# Patient Record
Sex: Male | Born: 1995
Health system: Southern US, Community
[De-identification: ages and names within clinical notes are randomized; demographics above are authoritative.]

## PROBLEM LIST (undated history)

## (undated) DIAGNOSIS — M549 Dorsalgia, unspecified: Secondary | ICD-10-CM

## (undated) DIAGNOSIS — J45909 Unspecified asthma, uncomplicated: Secondary | ICD-10-CM

## (undated) DIAGNOSIS — N4400 Torsion of testis, unspecified: Secondary | ICD-10-CM

## (undated) HISTORY — PX: SURGERY SCROTAL / TESTICULAR: SUR1316

---
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
## 2016-09-18 ENCOUNTER — Emergency Department (HOSPITAL_COMMUNITY)
Admission: EM | Admit: 2016-09-18 | Discharge: 2016-09-18 | Disposition: A | Discharge status: Home / Self Care | Payer: Managed Care, Other (non HMO) | Attending: Emergency Medicine | Admitting: Emergency Medicine

## 2016-09-18 DIAGNOSIS — J45901 Unspecified asthma with (acute) exacerbation: Secondary | ICD-10-CM | POA: Insufficient documentation

## 2016-09-18 MED ORDER — ALBUTEROL SULFATE HFA 108 (90 BASE) MCG/ACT IN AERS
2.0000 | INHALATION_SPRAY | Freq: Once | RESPIRATORY_TRACT | Status: AC
  Administered 2016-09-18: 2 via RESPIRATORY_TRACT
  Filled 2016-09-18: qty 6.7

## 2016-09-18 MED ORDER — PREDNISONE 20 MG PO TABS
40.0000 mg | ORAL_TABLET | Freq: Once | ORAL | Status: AC
  Administered 2016-09-18: 40 mg via ORAL
  Filled 2016-09-18: qty 2

## 2016-09-18 MED ORDER — ALBUTEROL SULFATE HFA 108 (90 BASE) MCG/ACT IN AERS: 1.0000 | INHALATION_SPRAY | Freq: Four times a day (QID) | RESPIRATORY_TRACT | 0 refills | Status: DC | PRN

## 2016-09-18 MED ORDER — PREDNISONE 20 MG PO TABS: 40.0000 mg | ORAL_TABLET | Freq: Every day | ORAL | 0 refills | Status: AC

## 2016-09-18 NOTE — ED Triage Notes (Signed)
C/o sob onset several days ago states he just moved back here from [place] 2 days ago and hasn't had his inhaler for about 2 weeks. States sob  Got worse last pm

## 2016-09-18 NOTE — ED Provider Notes (Signed)
Emergency Department Provider Note   I have reviewed the triage vital signs and the nursing notes.   HISTORY  Chief Complaint Shortness of Breath   HPI Stephen Andrews is a 21 y.o. male with PMH of asthma presents to the emergency department for evaluation of difficulty breathing. His symptoms started yesterday after mowing the lawn. They worsened throughout the night and this morning. He has recently moved back to the area from [place] and ran out of his albuterol inhaler prescription. With symptoms worsening he called EMS and received a breathing treatment in route. He states after that he is feeling much better. No fevers or chills. No chest pain. No vomiting or diarrhea. Symptoms worse with exertion. No radiating symptoms.    No past medical history on file.  There are no active problems to display for this patient.   No past surgical history on file.    Allergies Patient has no allergy information on record.  No family history on file.  Social History Social History  Substance Use Topics  . Smoking status: Not on file  . Smokeless tobacco: Not on file  . Alcohol use Not on file    Review of Systems  Constitutional: No fever/chills Eyes: No visual changes. ENT: No sore throat. Cardiovascular: Denies chest pain. Respiratory: Positive shortness of breath. Gastrointestinal: No abdominal pain.  No nausea, no vomiting.  No diarrhea.  No constipation. Genitourinary: Negative for dysuria. Musculoskeletal: Negative for back pain. Skin: Negative for rash. Neurological: Negative for headaches, focal weakness or numbness.  10-point ROS otherwise negative.  ____________________________________________   PHYSICAL EXAM:  VITAL SIGNS: ED Triage Vitals [09/18/16 0816]  Enc Vitals Group     BP (!) 142/72     Pulse Rate 82     Resp 16     Temp 97.6 F (36.4 C)     Temp Source Oral     SpO2 99 %     Weight 175 lb (79.4 kg)     Height 5' 8.5" (1.74 m)    Constitutional: Alert and oriented. Well appearing and in no acute distress. Eyes: Conjunctivae are normal.  Head: Atraumatic. Nose: No congestion/rhinnorhea. Mouth/Throat: Mucous membranes are moist.  Oropharynx non-erythematous. Neck: No stridor.   Cardiovascular: Normal rate, regular rhythm. Good peripheral circulation. Grossly normal heart sounds.   Respiratory: Normal respiratory effort.  No retractions. Lungs CTAB. Gastrointestinal: Soft and nontender. No distention.  Musculoskeletal: No lower extremity tenderness nor edema. No gross deformities of extremities. Neurologic:  Normal speech and language. No gross focal neurologic deficits are appreciated.  Skin:  Skin is warm, dry and intact. No rash noted.  ____________________________________________   PROCEDURES  Procedure(s) performed:   Procedures  None ____________________________________________   INITIAL IMPRESSION / ASSESSMENT AND PLAN / ED COURSE  Pertinent labs & imaging results that were available during my care of the patient were reviewed by me and considered in my medical decision making (see chart for details).  Patient resents to the emergency department for evaluation of difficulty breathing with presumed asthma exacerbation. He is out of his albuterol inhaler at home but improved significantly with albuterol neb given by EMS. On my evaluation the patient is awake, alert, comfortable. No wheezing on exam. No hypoxemia. No fevers or chills. Plan to provide albuterol inhaler here in the emergency department along with steroid burst. Will also refer to PCP for continued outpatient mgmt.   At this time, I do not feel there is any life-threatening condition present. I have reviewed  and discussed all results (EKG, imaging, lab, urine as appropriate), exam findings with patient. I have reviewed nursing notes and appropriate previous records.  I feel the patient is safe to be discharged home without further emergent  workup. Discussed usual and customary return precautions. Patient and family (if present) verbalize understanding and are comfortable with this plan.  Patient will follow-up with their primary care provider. If they do not have a primary care provider, information for follow-up has been provided to them. All questions have been answered.  ____________________________________________  FINAL CLINICAL IMPRESSION(S) / ED DIAGNOSES  Final diagnoses:  Mild asthma with exacerbation, unspecified whether persistent     MEDICATIONS GIVEN DURING THIS VISIT:  Medications  albuterol (PROVENTIL HFA;VENTOLIN HFA) 108 (90 Base) MCG/ACT inhaler 2 puff (not administered)  predniSONE (DELTASONE) tablet 40 mg (not administered)     NEW OUTPATIENT MEDICATIONS STARTED DURING THIS VISIT:  New Prescriptions   ALBUTEROL (PROVENTIL HFA;VENTOLIN HFA) 108 (90 BASE) MCG/ACT INHALER    Inhale 1-2 puffs into the lungs every 6 (six) hours as needed for wheezing or shortness of breath.   PREDNISONE (DELTASONE) 20 MG TABLET    Take 2 tablets (40 mg total) by mouth daily.     Note:  This document was prepared using Dragon voice recognition software and may include unintentional dictation errors.  Alona Bene, MD Emergency Medicine   Long, Arlyss Repress, MD 09/18/16 [phone]

## 2016-09-18 NOTE — Discharge Instructions (Signed)
We believe that your symptoms are caused today by an exacerbation of your asthma.  Please take the prescribed medications and any medications that you have at home.  Follow up with your doctor as recommended.  If you develop any new or worsening symptoms, including but not limited to fever, persistent vomiting, worsening shortness of breath, or other symptoms that concern you, please return to the Emergency Department immediately. ° ° °Asthma °Asthma is a recurring condition in which the airways tighten and narrow. Asthma can make it difficult to breathe. It can cause coughing, wheezing, and shortness of breath. Asthma episodes, also called asthma attacks, range from minor to life-threatening. Asthma cannot be cured, but medicines and lifestyle changes can help control it. °CAUSES °Asthma is believed to be caused by inherited (genetic) and environmental factors, but its exact cause is unknown. Asthma may be triggered by allergens, lung infections, or irritants in the air. Asthma triggers are different for each person. Common triggers include:  °Animal dander. °Dust mites. °Cockroaches. °Pollen from trees or grass. °Mold. °Smoke. °Air pollutants such as dust, household cleaners, hair sprays, aerosol sprays, paint fumes, strong chemicals, or strong odors. °Cold air, weather changes, and winds (which increase molds and pollens in the air). °Strong emotional expressions such as crying or laughing hard. °Stress. °Certain medicines (such as aspirin) or types of drugs (such as beta-blockers). °Sulfites in foods and drinks. Foods and drinks that may contain sulfites include dried fruit, potato chips, and sparkling grape juice. °Infections or inflammatory conditions such as the flu, a cold, or an inflammation of the nasal membranes (rhinitis). °Gastroesophageal reflux disease (GERD). °Exercise or strenuous activity. °SYMPTOMS °Symptoms may occur immediately after asthma is triggered or many hours later. Symptoms  include: °Wheezing. °Excessive nighttime or early morning coughing. °Frequent or severe coughing with a common cold. °Chest tightness. °Shortness of breath. °DIAGNOSIS  °The diagnosis of asthma is made by a review of your medical history and a physical exam. Tests may also be performed. These may include: °Lung function studies. These tests show how much air you breathe in and out. °Allergy tests. °Imaging tests such as X-rays. °TREATMENT  °Asthma cannot be cured, but it can usually be controlled. Treatment involves identifying and avoiding your asthma triggers. It also involves medicines. There are 2 classes of medicine used for asthma treatment:  °Controller medicines. These prevent asthma symptoms from occurring. They are usually taken every day. °Reliever or rescue medicines. These quickly relieve asthma symptoms. They are used as needed and provide short-term relief. °Your health care provider will help you create an asthma action plan. An asthma action plan is a written plan for managing and treating your asthma attacks. It includes a list of your asthma triggers and how they may be avoided. It also includes information on when medicines should be taken and when their dosage should be changed. An action plan may also involve the use of a device called a peak flow meter. A peak flow meter measures how well the lungs are working. It helps you monitor your condition. °HOME CARE INSTRUCTIONS  °Take medicines only as directed by your health care provider. Speak with your health care provider if you have questions about how or when to take the medicines. °Use a peak flow meter as directed by your health care provider. Record and keep track of readings. °Understand and use the action plan to help minimize or stop an asthma attack without needing to seek medical care. °Control your home environment in the following   ways to help prevent asthma attacks: °Do not smoke. Avoid being exposed to secondhand smoke. °Change  your heating and air conditioning filter regularly. °Limit your use of fireplaces and wood stoves. °Get rid of pests (such as roaches and mice) and their droppings. °Throw away plants if you see mold on them. °Clean your floors and dust regularly. Use unscented cleaning products. °Try to have someone else vacuum for you regularly. Stay out of rooms while they are being vacuumed and for a short while afterward. If you vacuum, use a dust mask from a hardware store, a double-layered or microfilter vacuum cleaner bag, or a vacuum cleaner with a HEPA filter. °Replace carpet with wood, tile, or vinyl flooring. Carpet can trap dander and dust. °Use allergy-proof pillows, mattress covers, and box spring covers. °Wash bed sheets and blankets every week in hot water and dry them in a dryer. °Use blankets that are made of polyester or cotton. °Clean bathrooms and kitchens with bleach. If possible, have someone repaint the walls in these rooms with mold-resistant paint. Keep out of the rooms that are being cleaned and painted. °Wash hands frequently. °SEEK MEDICAL CARE IF:  °You have wheezing, shortness of breath, or a cough even if taking medicine to prevent attacks. °The colored mucus you cough up (sputum) is thicker than usual. °Your sputum changes from clear or white to yellow, green, gray, or bloody. °You have any problems that may be related to the medicines you are taking (such as a rash, itching, swelling, or trouble breathing). °You are using a reliever medicine more than 2-3 times per week. °Your peak flow is still at 50-79% of your personal best after following your action plan for 1 hour. °You have a fever. °SEEK IMMEDIATE MEDICAL CARE IF:  °You seem to be getting worse and are unresponsive to treatment during an asthma attack. °You are short of breath even at rest. °You get short of breath when doing very little physical activity. °You have difficulty eating, drinking, or talking due to asthma symptoms. °You  develop chest pain. °You develop a fast heartbeat. °You have a bluish color to your lips or fingernails. °You are light-headed, dizzy, or faint. °Your peak flow is less than 50% of your personal best. °MAKE SURE YOU:  °Understand these instructions. °Will watch your condition. °Will get help right away if you are not doing well or get worse. °Document Released: 04/10/2005 Document Revised: 08/25/2013 Document Reviewed: 11/07/2012 °[employer]® Patient Information ©2015 [employer]. This information is not intended to replace advice given to you by your health care provider. Make sure you discuss any questions you have with your health care provider. ° °How to Use a Nebulizer °If you have asthma or other breathing problems, you might need to breathe in (inhale) medicine. This can be done with a nebulizer. A nebulizer is a device that turns liquid medicine into a mist that you can inhale.  °There are different kinds of nebulizers. Most are small. With some, you breathe in through a mouthpiece. With others, a mask fits over your nose and mouth. Most nebulizers must be connected to a small air compressor. Air is forced through tubing from the compressor to the nebulizer. The forced air changes the liquid into a fine spray. °RISKS AND COMPLICATIONS °The nebulizer must work properly for it to help your breathing. If the nebulizer does not produce mist, or if foam comes out, this indicates that the nebulizer is not working properly. Sometimes a filter can get clogged, or   there might be a problem with the air compressor. Check the instruction booklet that came with your nebulizer. It should tell you how to fix problems or where to call for help. You should have at least one extra nebulizer at home. That way, you will always have one when you need it.  °HOW TO PREPARE BEFORE USING THE NEBULIZER °Take these steps before using the nebulizer: °Check your medicine. Make sure it has not expired and is not damaged in any way.    °Wash your hands with soap and water.   °Put all the parts of your nebulizer on a sturdy, flat surface. Make sure the tubing connects the compressor and the nebulizer. °Measure the liquid medicine according to your health care provider's instructions. Pour it into the nebulizer. °Attach the mouthpiece or mask.   °Test the nebulizer by turning it on to make sure a spray is coming out. Then, turn it off.   °HOW TO USE THE NEBULIZER °Sit down and focus on staying relaxed.   °If your nebulizer has a mask, put it over your nose and mouth. If you use a mouthpiece, put it in your mouth. Press your lips firmly around the mouthpiece. °Turn on the nebulizer.   °Breathe out.   °Some nebulizers have a finger valve. If yours does, cover up the air hole so the air gets to the nebulizer. °Once the medicine begins to mist out, take slow, deep breaths. If there is a finger valve, release it at the end of your breath. °Continue taking slow, deep breaths until the nebulizer is empty.   °Be sure to stop the machine at any point if you start coughing or if the medicine foams or bubbles. °HOW TO CLEAN THE NEBULIZER  °The nebulizer and all its parts must be kept very clean. Follow the manufacturer's instructions for cleaning. For most nebulizers, you should follow these guidelines: °Wash the nebulizer after each use. Use warm water and soap. Rinse it well. Shake the nebulizer to remove extra water. Put it on a clean towel until it is completely dry. To make sure it is dry, put the nebulizer back together. Turn on the compressor for a few minutes. This will blow air through the nebulizer.   °Do not wash the tubing or the finger valve.   °Store the nebulizer in a dust-free place.   °Inspect the filter every week. Replace it any time it looks dirty.   °Sometimes the nebulizer will need a more complete cleaning. The instruction booklet should say how often you need to do this. °SEEK MEDICAL CARE IF:  °You continue to have difficulty  breathing.   °You have trouble using the nebulizer.   °Document Released: 03/29/2009 Document Revised: 08/25/2013 Document Reviewed: 09/30/2012 °[employer]® Patient Information ©2015 [employer]. This information is not intended to replace advice given to you by your health care provider. Make sure you discuss any questions you have with your health care provider. ° °How to Use an Inhaler °Proper inhaler technique is very important. Good technique ensures that the medicine reaches the lungs. Poor technique results in depositing the medicine on the tongue and back of the throat rather than in the airways. If you do not use the inhaler with good technique, the medicine will not help you. °STEPS TO FOLLOW IF USING AN INHALER WITHOUT AN EXTENSION TUBE °Remove the cap from the inhaler. °If you are using the inhaler for the first time, you will need to prime it. Shake the inhaler for   5 seconds and release four puffs into the air, away from your face. Ask your health care provider or pharmacist if you have questions about priming your inhaler. °Shake the inhaler for 5 seconds before each breath in (inhalation). °Position the inhaler so that the top of the canister faces up. °Put your index finger on the top of the medicine canister. Your thumb supports the bottom of the inhaler. °Open your mouth. °Either place the inhaler between your teeth and place your lips tightly around the mouthpiece, or hold the inhaler 1-2 inches away from your open mouth. If you are unsure of which technique to use, ask your health care provider. °Breathe out (exhale) normally and as completely as possible. °Press the canister down with your index finger to release the medicine. °At the same time as the canister is pressed, inhale deeply and slowly until your lungs are completely filled. This should take 4-6 seconds. Keep your tongue down. °Hold the medicine in your lungs for 5-10 seconds (10 seconds is best). This helps the medicine get into the  small airways of your lungs. °Breathe out slowly, through pursed lips. Whistling is an example of pursed lips. °Wait at least 15-30 seconds between puffs. Continue with the above steps until you have taken the number of puffs your health care provider has ordered. Do not use the inhaler more than your health care provider tells you. °Replace the cap on the inhaler. °Follow the directions from your health care provider or the inhaler insert for cleaning the inhaler. °STEPS TO FOLLOW IF USING AN INHALER WITH AN EXTENSION (SPACER) °Remove the cap from the inhaler. °If you are using the inhaler for the first time, you will need to prime it. Shake the inhaler for 5 seconds and release four puffs into the air, away from your face. Ask your health care provider or pharmacist if you have questions about priming your inhaler. °Shake the inhaler for 5 seconds before each breath in (inhalation). °Place the open end of the spacer onto the mouthpiece of the inhaler. °Position the inhaler so that the top of the canister faces up and the spacer mouthpiece faces you. °Put your index finger on the top of the medicine canister. Your thumb supports the bottom of the inhaler and the spacer. °Breathe out (exhale) normally and as completely as possible. °Immediately after exhaling, place the spacer between your teeth and into your mouth. Close your lips tightly around the spacer. °Press the canister down with your index finger to release the medicine. °At the same time as the canister is pressed, inhale deeply and slowly until your lungs are completely filled. This should take 4-6 seconds. Keep your tongue down and out of the way. °Hold the medicine in your lungs for 5-10 seconds (10 seconds is best). This helps the medicine get into the small airways of your lungs. Exhale. °Repeat inhaling deeply through the spacer mouthpiece. Again hold that breath for up to 10 seconds (10 seconds is best). Exhale slowly. If it is difficult to take  this second deep breath through the spacer, breathe normally several times through the spacer. Remove the spacer from your mouth. °Wait at least 15-30 seconds between puffs. Continue with the above steps until you have taken the number of puffs your health care provider has ordered. Do not use the inhaler more than your health care provider tells you. °Remove the spacer from the inhaler, and place the cap on the inhaler. °Follow the directions from your health care provider   or the inhaler insert for cleaning the inhaler and spacer. °If you are using different kinds of inhalers, use your quick relief medicine to open the airways 10-15 minutes before using a steroid if instructed to do so by your health care provider. If you are unsure which inhalers to use and the order of using them, ask your health care provider, nurse, or respiratory therapist. °If you are using a steroid inhaler, always rinse your mouth with water after your last puff, then gargle and spit out the water. Do not swallow the water. °AVOID: °Inhaling before or after starting the spray of medicine. It takes practice to coordinate your breathing with triggering the spray. °Inhaling through the nose (rather than the mouth) when triggering the spray. °HOW TO DETERMINE IF YOUR INHALER IS FULL OR NEARLY EMPTY °You cannot know when an inhaler is empty by shaking it. A few inhalers are now being made with dose counters. Ask your health care provider for a prescription that has a dose counter if you feel you need that extra help. If your inhaler does not have a counter, ask your health care provider to help you determine the date you need to refill your inhaler. Write the refill date on a calendar or your inhaler canister. Refill your inhaler 7-10 days before it runs out. Be sure to keep an adequate supply of medicine. This includes making sure it is not expired, and that you have a spare inhaler.  °SEEK MEDICAL CARE IF:  °Your symptoms are only partially  relieved with your inhaler. °You are having trouble using your inhaler. °You have some increase in phlegm. °SEEK IMMEDIATE MEDICAL CARE IF:  °You feel little or no relief with your inhalers. You are still wheezing and are feeling shortness of breath or tightness in your chest or both. °You have dizziness, headaches, or a fast heart rate. °You have chills, fever, or night sweats. °You have a noticeable increase in phlegm production, or there is blood in the phlegm. °MAKE SURE YOU:  °Understand these instructions. °Will watch your condition. °Will get help right away if you are not doing well or get worse. °Document Released: 04/07/2000 Document Revised: 01/29/2013 Document Reviewed: 11/07/2012 °[employer]® Patient Information ©2015 [employer]. This information is not intended to replace advice given to you by your health care provider. Make sure you discuss any questions you have with your health care provider. ° ° ° °

## 2016-10-26 ENCOUNTER — Encounter (HOSPITAL_COMMUNITY): Payer: Self-pay

## 2016-10-26 ENCOUNTER — Emergency Department (HOSPITAL_COMMUNITY)
Admission: EM | Admit: 2016-10-26 | Discharge: 2016-10-26 | Disposition: A | Discharge status: Home / Self Care | Payer: Self-pay | Attending: Emergency Medicine | Admitting: Emergency Medicine

## 2016-10-26 ENCOUNTER — Emergency Department (HOSPITAL_COMMUNITY): Payer: Self-pay

## 2016-10-26 DIAGNOSIS — M545 Low back pain, unspecified: Secondary | ICD-10-CM

## 2016-10-26 HISTORY — DX: Torsion of testis, unspecified: N44.00

## 2016-10-26 MED ORDER — DIAZEPAM 5 MG PO TABS
5.0000 mg | ORAL_TABLET | Freq: Once | ORAL | Status: AC
  Administered 2016-10-26: 5 mg via ORAL
  Filled 2016-10-26: qty 1

## 2016-10-26 MED ORDER — NAPROXEN 375 MG PO TABS: 375.0000 mg | ORAL_TABLET | Freq: Two times a day (BID) | ORAL | 0 refills | Status: DC

## 2016-10-26 MED ORDER — CYCLOBENZAPRINE HCL 10 MG PO TABS: 10.0000 mg | ORAL_TABLET | Freq: Two times a day (BID) | ORAL | 0 refills | Status: DC | PRN

## 2016-10-26 MED ORDER — KETOROLAC TROMETHAMINE 30 MG/ML IJ SOLN
30.0000 mg | Freq: Once | INTRAMUSCULAR | Status: AC
  Administered 2016-10-26: 30 mg via INTRAMUSCULAR
  Filled 2016-10-26: qty 1

## 2016-10-26 NOTE — ED Triage Notes (Signed)
Pt presents with c/o back pain that started a couple of days ago. Pt denies any injury to his back. Ambulatory to triage.

## 2016-10-26 NOTE — ED Provider Notes (Signed)
[facility]-EMERGENCY DEPT Provider Note   CSN: [id number] Arrival date & time: 10/26/16  1805     History   Chief Complaint Chief Complaint  Patient presents with  . Back Pain    HPI Stephen Andrews is a 21 y.o. male.  HPI 60 year old African-American male with no significant past medical history presents to the emergency Department today with complaints of left lower back pain. Patient denies any trauma. States that it is a muscle ache in the left lower back that is constant. Movement and sitting makes the pain worse. Nothing makes the pain better. States that he does sit a lot for work. He has tried muscle rubs, icy hot, Motrin with little relief. Patient has been ambulatory. Pain has been ongoing for 2 days. Pt denies any fever, ha, night sweats, hx of ivdu/cancer, loss or bowel or bladder, urinary retention, saddle paresthesias, urinary symptoms, lower extremity paresthesias.   Past Medical History:  Diagnosis Date  . Testicular torsion     There are no active problems to display for this patient.   Past Surgical History:  Procedure Laterality Date  . SURGERY SCROTAL / TESTICULAR     testicular torsion surgery        Home Medications    Prior to Admission medications   Medication Sig Start Date End Date Taking? Authorizing Provider  albuterol (PROVENTIL HFA;VENTOLIN HFA) 108 (90 Base) MCG/ACT inhaler Inhale 1-2 puffs into the lungs every 6 (six) hours as needed for wheezing or shortness of breath. 09/18/16   Long, Arlyss Repress, MD  cyclobenzaprine (FLEXERIL) 10 MG tablet Take 1 tablet (10 mg total) by mouth 2 (two) times daily as needed for muscle spasms. 10/26/16   Rise Mu, PA-C  naproxen (NAPROSYN) 375 MG tablet Take 1 tablet (375 mg total) by mouth 2 (two) times daily. 10/26/16   Rise Mu, PA-C    Family History History reviewed. No pertinent family history.  Social History Social History  Substance Use Topics  . Smoking status: Never Smoker  .  Smokeless tobacco: Never Used  . Alcohol use Yes     Comment: socially      Allergies   Patient has no known allergies.   Review of Systems Review of Systems  Constitutional: Negative for chills and fever.  Gastrointestinal: Negative for nausea and vomiting.  Genitourinary: Negative for dysuria, frequency and urgency.  Musculoskeletal: Positive for back pain. Negative for gait problem.  Skin: Negative for rash and wound.  Neurological: Negative for weakness and numbness.     Physical Exam Updated Vital Signs BP 132/75 (BP Location: Left Arm)   Pulse (!) 57   Temp 97.9 F (36.6 C) (Oral)   Resp 18   Ht 5\' 9"  (1.753 m)   Wt 81.6 kg (180 lb)   SpO2 100%   BMI 26.58 kg/m   Physical Exam  Constitutional: He is oriented to person, place, and time. He appears well-developed and well-nourished. No distress.  HENT:  Head: Normocephalic and atraumatic.  Eyes: Right eye exhibits no discharge. Left eye exhibits no discharge. No scleral icterus.  Neck: Normal range of motion.  Cardiovascular: Intact distal pulses.   Pulmonary/Chest: No respiratory distress.  Abdominal:  No CVA tenderness.  Musculoskeletal: Normal range of motion.  No midline T spine or L spine tenderness. No deformities or step offs noted. Full ROM. Pelvis is stable.  Patient with left lower lumbar tenderness. Tense musculature noted. No CVA tenderness.  DP pulses are 2+ bilaterally. Sensation intact. Cap  refill normal.   Neurological: He is alert and oriented to person, place, and time.  Sensation intact. Strength 5 out of 5 in lower extremities.  Skin: Skin is warm and dry. Capillary refill takes less than 2 seconds. No pallor.  Psychiatric: His behavior is normal. Judgment and thought content normal.  Nursing note and vitals reviewed.    ED Treatments / Results  Labs (all labs ordered are listed, but only abnormal results are displayed) Labs Reviewed - No data to display  EKG  EKG  Interpretation None       Radiology Dg Lumbar Spine Complete  Result Date: 10/26/2016 CLINICAL DATA:  Acute low back and lumbar spine pain for 2 days. No known injury. Initial encounter. EXAM: LUMBAR SPINE - COMPLETE 4+ VIEW COMPARISON:  None. FINDINGS: There is no evidence of lumbar spine fracture. Alignment is normal. Intervertebral disc spaces are maintained. IMPRESSION: Negative. Electronically Signed   By: Harmon PierJeffrey  Hu M.D.   On: 10/26/2016 20:21    Procedures Procedures (including critical care time)  Medications Ordered in ED Medications  ketorolac (TORADOL) 30 MG/ML injection 30 mg (30 mg Intramuscular Given 10/26/16 1954)  diazepam (VALIUM) tablet 5 mg (5 mg Oral Given 10/26/16 1952)     Initial Impression / Assessment and Plan / ED Course  I have reviewed the triage vital signs and the nursing notes.  Pertinent labs & imaging results that were available during my care of the patient were reviewed by me and considered in my medical decision making (see chart for details).     Patient with back pain.  No neurological deficits and normal neuro exam.  Patient can walk but states is painful.  No loss of bowel or bladder control.  No concern for cauda equina.  No fever, night sweats, weight loss, h/o cancer, IVDU.  RICE protocol and pain medicine indicated and discussed with patient.    Final Clinical Impressions(s) / ED Diagnoses   Final diagnoses:  Acute left-sided low back pain without sciatica    New Prescriptions Discharge Medication List as of 10/26/2016  8:29 PM    START taking these medications   Details  cyclobenzaprine (FLEXERIL) 10 MG tablet Take 1 tablet (10 mg total) by mouth 2 (two) times daily as needed for muscle spasms., Starting Thu 10/26/2016, Print    naproxen (NAPROSYN) 375 MG tablet Take 1 tablet (375 mg total) by mouth 2 (two) times daily., Starting Thu 10/26/2016, Print         Rise MuLeaphart, [place] Chelf T, PA-C 10/26/16 2048    Little, Ambrose Finlandachel Morgan,  MD 10/27/16 [phone]

## 2016-10-26 NOTE — Discharge Instructions (Signed)
Workup has been normal. Please take medications as prescribed and instructed. ° °Please take the Naproxen as prescribed for pain. Do not take any additional NSAIDs including Motrin, Aleve, Ibuprofen, Advil. ° °Please the the flexeril for muscle relaxation. This medication will make you drowsy so avoid situation that could place you in danger.  ° ° °SEEK IMMEDIATE MEDICAL ATTENTION IF: °New numbness, tingling, weakness, or problem with the use of your arms or legs.  °Severe back pain not relieved with medications.  °Change in bowel or bladder control.  °Urinary retention.  °Numbness in your groin.  °Increasing pain in any areas of the body (such as chest or abdominal pain).  °Shortness of breath, dizziness or fainting.  °Nausea (feeling sick to your stomach), vomiting, fever, or sweats. ° °

## 2016-10-30 ENCOUNTER — Emergency Department (HOSPITAL_COMMUNITY): Payer: Managed Care, Other (non HMO)

## 2016-10-30 ENCOUNTER — Encounter (HOSPITAL_COMMUNITY): Payer: Self-pay | Admitting: *Deleted

## 2016-10-30 ENCOUNTER — Emergency Department (HOSPITAL_COMMUNITY)
Admission: EM | Admit: 2016-10-30 | Discharge: 2016-10-30 | Disposition: A | Discharge status: Home / Self Care | Payer: Managed Care, Other (non HMO) | Attending: Emergency Medicine | Admitting: Emergency Medicine

## 2016-10-30 DIAGNOSIS — J4521 Mild intermittent asthma with (acute) exacerbation: Secondary | ICD-10-CM | POA: Insufficient documentation

## 2016-10-30 HISTORY — DX: Unspecified asthma, uncomplicated: J45.909

## 2016-10-30 HISTORY — DX: Dorsalgia, unspecified: M54.9

## 2016-10-30 MED ORDER — ALBUTEROL SULFATE HFA 108 (90 BASE) MCG/ACT IN AERS
2.0000 | INHALATION_SPRAY | Freq: Four times a day (QID) | RESPIRATORY_TRACT | Status: DC | PRN
  Administered 2016-10-30: 2 via RESPIRATORY_TRACT
  Filled 2016-10-30: qty 6.7

## 2016-10-30 MED ORDER — PREDNISONE 20 MG PO TABS: ORAL_TABLET | ORAL | 0 refills | Status: DC

## 2016-10-30 NOTE — ED Notes (Signed)
Bed: WA21 Expected date:  Expected time:  Means of arrival:  Comments: 21 yo F/ Shortness of breath

## 2016-10-30 NOTE — ED Triage Notes (Signed)
While at work pt felt shot of breath. Has history of asthma however d/t issues with insurance he hasn't been taking his inhalers. Pt states it became more difficult to breathe and so he called EMS. In route pt received 125mg  solumedrol, 10mg  Albuterol nebulizer and 1mg  Atrovent nebulizer. Pt's original oxygen sats were 95% RM AIR with EMS. Presently 97% on RM AIR.

## 2016-10-30 NOTE — Discharge Instructions (Signed)
Take the prescribed medication as directed. Follow-up with [facility]-- can call in the morning for appointment. Return to the ED for new or worsening symptoms.

## 2016-10-30 NOTE — ED Provider Notes (Signed)
[facility] Provider Note   CSN: [id number] Arrival date & time: 10/30/16  0327     History   Chief Complaint Chief Complaint  Patient presents with  . Respiratory Distress    HPI Stephen Andrews is a 21 y.o. male.  The history is provided by the patient and medical records.    21 year old male with history of asthma, back pain, presenting to the ED for shortness of breath. Patient reports he recently relocated to [facility] from [place] about 3 weeks ago. 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 States he tried to tough it out and when on home. States he took a shower and got ready to get in bed and became increasingly short of breath. He usually uses albuterol for his asthma, but he does not have any of his home inhalers. He called EMS and was given Solu-Medrol, albuterol, and Atrovent with vast improvement of his symptoms. On initial evaluation here, patient states he feels back to baseline.  He denies any recent cough, fever, chest pain.  No cardiac hx.  Past Medical History:  Diagnosis Date  . Asthma   . Back pain   . Testicular torsion     There are no active problems to display for this patient.   Past Surgical History:  Procedure Laterality Date  . SURGERY SCROTAL / TESTICULAR     testicular torsion surgery        Home Medications    Prior to Admission medications   Medication Sig Start Date End Date Taking? Authorizing Provider  albuterol (PROVENTIL HFA;VENTOLIN HFA) 108 (90 Base) MCG/ACT inhaler Inhale 1-2 puffs into the lungs every 6 (six) hours as needed for wheezing or shortness of breath. 09/18/16   Long, Arlyss Repress, MD  cyclobenzaprine (FLEXERIL) 10 MG tablet Take 1 tablet (10 mg total) by mouth 2 (two) times daily as needed for muscle spasms. 10/26/16   Rise Mu, PA-C  naproxen (NAPROSYN) 375 MG tablet Take 1 tablet (375 mg total) by mouth 2 (two) times daily. 10/26/16   Rise Mu,  PA-C    Family History History reviewed. No pertinent family history.  Social History Social History  Substance Use Topics  . Smoking status: Never Smoker  . Smokeless tobacco: Never Used  . Alcohol use Yes     Comment: socially      Allergies   Patient has no known allergies.   Review of Systems Review of Systems  Respiratory: Positive for shortness of breath and wheezing.   All other systems reviewed and are negative.    Physical Exam Updated Vital Signs BP 135/77 (BP Location: Right Arm)   Pulse 86   Temp 97.8 F (36.6 C) (Oral)   Ht 5\' 9"  (1.753 m)   Wt 81.6 kg (180 lb)   SpO2 100%   BMI 26.58 kg/m   Physical Exam  Constitutional: He is oriented to person, place, and time. He appears well-developed and well-nourished.  HENT:  Head: Normocephalic and atraumatic.  Mouth/Throat: Oropharynx is clear and moist.  Eyes: Conjunctivae and EOM are normal. Pupils are equal, round, and reactive to light.  Neck: Normal range of motion.  Cardiovascular: Normal rate, regular rhythm and normal heart sounds.   Pulmonary/Chest: Effort normal and breath sounds normal. No respiratory distress. He has no wheezes.  Respirations unlabored, able to speak in full sentences, lungs clear bilaterally, O2 sats 98% during exam  Abdominal: Soft. Bowel sounds are normal.  Musculoskeletal: Normal range of motion.  Neurological: He is alert and oriented to person, place, and time.  Skin: Skin is warm and dry.  Psychiatric: He has a normal mood and affect.  Nursing note and vitals reviewed.    ED Treatments / Results  Labs (all labs ordered are listed, but only abnormal results are displayed) Labs Reviewed - No data to display  EKG  EKG Interpretation None       Radiology Dg Chest 2 View  Result Date: 10/30/2016 CLINICAL DATA:  21 year old male with shortness of breath. History of asthma. EXAM: CHEST  2 VIEW COMPARISON:  None. FINDINGS: The heart size and mediastinal contours  are within normal limits. Both lungs are clear. The visualized skeletal structures are unremarkable. IMPRESSION: No active cardiopulmonary disease. Electronically Signed   By: Elgie CollardArash  Radparvar M.D.   On: 10/30/2016 04:11    Procedures Procedures (including critical care time)  Medications Ordered in ED Medications  albuterol (PROVENTIL HFA;VENTOLIN HFA) 108 (90 Base) MCG/ACT inhaler 2 puff (not administered)     Initial Impression / Assessment and Plan / ED Course  I have reviewed the triage vital signs and the nursing notes.  Pertinent labs & imaging results that were available during my care of the patient were reviewed by me and considered in my medical decision making (see chart for details).  21 year old male here with asthma exacerbation. [place] [facility] [place]. Does not currently have any home medications to assist with his symptoms. He is afebrile and nontoxic in appearance here. He was treated with slight Medrol, albuterol, and Atrovent with EMS with resolution of symptoms. Respirations are clear and unlabored here.  CXR clear.  Patient continues to appear well here.  Appropriate for discharge.  Given albuterol inhaler here for home, will continue prednisone taper.  Discussed asthma exacerbation may be secondary to change in environment.  Patient is currently uninsured, referred to wellness clinic for follow-up.  Discussed plan with patient, he acknowledged understanding and agreed with plan of care.  Return precautions given for new or worsening symptoms.  Final Clinical Impressions(s) / ED Diagnoses   Final diagnoses:  Mild intermittent asthma with exacerbation    New Prescriptions New Prescriptions   PREDNISONE (DELTASONE) 20 MG TABLET    Take 40 mg by mouth daily for 3 days, then 20mg  by mouth daily for 3 days, then 10mg  daily for 3 days     Garlon HatchetSanders, Ireanna Finlayson M, PA-C 10/30/16 0553    Palumbo, April, MD 10/30/16 [phone]

## 2017-01-08 ENCOUNTER — Emergency Department (HOSPITAL_COMMUNITY)
Admission: EM | Admit: 2017-01-08 | Discharge: 2017-01-08 | Disposition: A | Discharge status: Home / Self Care | Payer: Self-pay | Attending: Emergency Medicine | Admitting: Emergency Medicine

## 2017-01-08 ENCOUNTER — Emergency Department (HOSPITAL_COMMUNITY): Payer: Self-pay

## 2017-01-08 DIAGNOSIS — J4521 Mild intermittent asthma with (acute) exacerbation: Secondary | ICD-10-CM | POA: Insufficient documentation

## 2017-01-08 MED ORDER — IPRATROPIUM-ALBUTEROL 0.5-2.5 (3) MG/3ML IN SOLN
3.0000 mL | Freq: Once | RESPIRATORY_TRACT | Status: AC
  Administered 2017-01-08: 3 mL via RESPIRATORY_TRACT
  Filled 2017-01-08: qty 3

## 2017-01-08 MED ORDER — PREDNISONE 20 MG PO TABS
40.0000 mg | ORAL_TABLET | Freq: Once | ORAL | Status: AC
  Administered 2017-01-08: 40 mg via ORAL
  Filled 2017-01-08: qty 2

## 2017-01-08 MED ORDER — ALBUTEROL SULFATE HFA 108 (90 BASE) MCG/ACT IN AERS
1.0000 | INHALATION_SPRAY | Freq: Once | RESPIRATORY_TRACT | Status: AC
  Administered 2017-01-08: 1 via RESPIRATORY_TRACT
  Filled 2017-01-08: qty 6.7

## 2017-01-08 MED ORDER — ALBUTEROL SULFATE HFA 108 (90 BASE) MCG/ACT IN AERS: 1.0000 | INHALATION_SPRAY | Freq: Four times a day (QID) | RESPIRATORY_TRACT | 0 refills | Status: DC | PRN

## 2017-01-08 MED ORDER — PREDNISONE 20 MG PO TABS: 40.0000 mg | ORAL_TABLET | Freq: Every day | ORAL | 0 refills | Status: DC

## 2017-01-08 NOTE — Discharge Instructions (Signed)
You have been treated for an asthma attack in the ED. Take prednisone daily for 4 days starting tomorrow morning.  ° °I have given you a refill of your home inhaler.  ° ° °Warm steam from a hot shower will help your symptoms.  ° °Follow up with your primary care provider for further discussion of today's ER visit.  ° °If you develop any new or worsening symptoms, including but not limited to fever, persistent vomiting, worsening shortness of breath or other symptoms that concern you, please return to the Emergency Department immediately. ° ° °

## 2017-01-08 NOTE — ED Notes (Signed)
Patient states he feels much better now. States he just moved from [place] and hasn't established care and insurance. Denies having inhalers and nebulizer at home. States he would need RX as well as access to care if possible until he can receive health benefits. A&OX4 laughing and talking. No signs of dyspnea at this point.

## 2017-01-08 NOTE — ED Provider Notes (Signed)
[facility] Provider Note   CSN: [id number] Arrival date & time: 01/08/17  0018     History   Chief Complaint Chief Complaint  Patient presents with  . Asthma    HPI Stephen Andrews is a 21 y.o. male.  HPI 21 year old African-American male past medical history significant for asthma presents to the emergency Department today with complaints of wheezing, chest tightness, shortness of breath secondary to his asthma. Patient states that this afternoon he woke up from a nap with shortness of breath and wheezing. States he went to get his inhaler and it was empty. Patient was brought in by EMS and noted to have bilateral expiratory and inspiratory wheezes. Was given 1 DuoNeb. With improvement in patient's breathing. On my assessment patient states that his breathing is at baseline. Patient states that he is not know what triggers his asthma. Denies tobacco use. Patient has been seen several times in the ED for same. Has had poor follow-up with PCP. His inhalers usually refilled in the ED. Patient denies any associated fevers, cough, chest pain, nausea, emesis, abdominal pain, headache, vision changes, lightheadedness, dizziness, lower extremity edema or calf tenderness. Past Medical History:  Diagnosis Date  . Asthma   . Back pain   . Testicular torsion     There are no active problems to display for this patient.   Past Surgical History:  Procedure Laterality Date  . SURGERY SCROTAL / TESTICULAR     testicular torsion surgery        Home Medications    Prior to Admission medications   Medication Sig Start Date End Date Taking? Authorizing Provider  albuterol (PROVENTIL HFA;VENTOLIN HFA) 108 (90 Base) MCG/ACT inhaler Inhale 1-2 puffs into the lungs every 6 (six) hours as needed for wheezing or shortness of breath. 01/08/17   Rise Mu, PA-C  predniSONE (DELTASONE) 20 MG tablet Take 2 tablets (40 mg total) by mouth daily with breakfast. 01/08/17   Rise Mu, PA-C    Family History No family history on file.  Social History Social History  Substance Use Topics  . Smoking status: Never Smoker  . Smokeless tobacco: Never Used  . Alcohol use Yes     Comment: socially      Allergies   Patient has no known allergies.   Review of Systems Review of Systems  Constitutional: Negative for chills and fever.  HENT: Negative for congestion.   Respiratory: Positive for shortness of breath and wheezing. Negative for cough.   Cardiovascular: Negative for chest pain, palpitations and leg swelling.  Gastrointestinal: Negative for abdominal pain, nausea and vomiting.  Musculoskeletal: Negative for myalgias.  Skin: Negative for rash.  Neurological: Negative for dizziness, weakness, light-headedness and headaches.  Psychiatric/Behavioral: Negative for sleep disturbance.     Physical Exam Updated Vital Signs BP 116/81 (BP Location: Left Arm)   Pulse 97   Temp 98.2 F (36.8 C) (Oral)   Resp 18   SpO2 99%   Physical Exam  Constitutional: He is oriented to person, place, and time. He appears well-developed and well-nourished.  Non-toxic appearance. No distress.  HENT:  Head: Normocephalic and atraumatic.  Mouth/Throat: Oropharynx is clear and moist.  Eyes: Pupils are equal, round, and reactive to light. Conjunctivae are normal. Right eye exhibits no discharge. Left eye exhibits no discharge.  Neck: Normal range of motion. Neck supple.  Cardiovascular: Normal rate, regular rhythm, normal heart sounds and intact distal pulses.  Exam reveals no gallop and no friction rub.  No murmur heard. Pulmonary/Chest: Effort normal. No accessory muscle usage. No tachypnea. No respiratory distress. He has decreased breath sounds. He has wheezes (minimal scattered extremity wheezes ). He has no rhonchi. He has no rales. He exhibits no tenderness.  No hypoxia.  Abdominal: Soft. Bowel sounds are normal. He exhibits no distension. There is no  tenderness. There is no rebound and no guarding.  Musculoskeletal: Normal range of motion. He exhibits no tenderness.  Lymphadenopathy:    He has no cervical adenopathy.  Neurological: He is alert and oriented to person, place, and time.  Skin: Skin is warm and dry. Capillary refill takes less than 2 seconds. No rash noted.  Psychiatric: His behavior is normal. Judgment and thought content normal.  Nursing note and vitals reviewed.    ED Treatments / Results  Labs (all labs ordered are listed, but only abnormal results are displayed) Labs Reviewed - No data to display  EKG  EKG Interpretation None       Radiology Dg Chest 2 View  Result Date: 01/08/2017 CLINICAL DATA:  Shortness of breath.  History of asthma.  Nonsmoker. EXAM: CHEST  2 VIEW COMPARISON:  10/30/2016 FINDINGS: Normal heart size and pulmonary vascularity. Central peribronchial thickening consistent with history of asthma. No airspace disease or consolidation in the lungs. No blunting of costophrenic angles. No pneumothorax. Mediastinal contours appear intact. IMPRESSION: Peribronchial thickening compatible with history of asthma. No evidence of active pulmonary disease. Electronically Signed   By: Burman Nieves M.D.   On: 01/08/2017 01:38    Procedures Procedures (including critical care time)  Medications Ordered in ED Medications  ipratropium-albuterol (DUONEB) 0.5-2.5 (3) MG/3ML nebulizer solution 3 mL (3 mLs Nebulization Given 01/08/17 0128)  predniSONE (DELTASONE) tablet 40 mg (40 mg Oral Given 01/08/17 0128)  albuterol (PROVENTIL HFA;VENTOLIN HFA) 108 (90 Base) MCG/ACT inhaler 1 puff (1 puff Inhalation Given 01/08/17 0239)     Initial Impression / Assessment and Plan / ED Course  I have reviewed the triage vital signs and the nursing notes.  Pertinent labs & imaging results that were available during my care of the patient were reviewed by me and considered in my medical decision making (see chart for  details).     Patient with mild signs and symptoms of asthma/RAD. Oxygen saturation is above 90%. No accessory muscle use, no cyanosis. Treated in the ED.  Patient feels improved after treatment. Chest x-ray is unremarkable for any focal infiltrate. Clinical presentation is not consistent with PE, ACS, dissection, pneumonia. Will discharge with predinsone and albuterol inhaler. Pt instructed to follow up with PCP. Patient is hemodynamically stable. Discussed return precautions. Pt appears safe for discharge.     Final Clinical Impressions(s) / ED Diagnoses   Final diagnoses:  Mild intermittent asthma with exacerbation    New Prescriptions New Prescriptions   ALBUTEROL (PROVENTIL HFA;VENTOLIN HFA) 108 (90 BASE) MCG/ACT INHALER    Inhale 1-2 puffs into the lungs every 6 (six) hours as needed for wheezing or shortness of breath.   PREDNISONE (DELTASONE) 20 MG TABLET    Take 2 tablets (40 mg total) by mouth daily with breakfast.     Rise Mu, PA-C 01/08/17 0241    Palumbo, April, MD 01/08/17 1610

## 2017-01-08 NOTE — ED Notes (Signed)
Patient ambulated around department. Oxygen sats maintained at 95-96%. Patient denies dyspnea. Able to talk during ambulation without difficulty.

## 2017-01-08 NOTE — ED Notes (Signed)
Bed: ZO10 Expected date:  Expected time:  Means of arrival:  Comments: 21yo M/ difficulty breathing

## 2017-01-08 NOTE — ED Triage Notes (Signed)
Pt from home via EMS with c/o sob secondary to asthma. Pt reports he ran out of his inhaler. Pt initially had bilateral wheezes but was given 10 albuterol and .05 Atrovent in route and now only has expiratory wheezes.

## 2017-02-04 ENCOUNTER — Emergency Department (HOSPITAL_COMMUNITY)
Admission: EM | Admit: 2017-02-04 | Discharge: 2017-02-05 | Disposition: A | Discharge status: Home / Self Care | Payer: Managed Care, Other (non HMO) | Attending: Emergency Medicine | Admitting: Emergency Medicine

## 2017-02-04 ENCOUNTER — Emergency Department (HOSPITAL_COMMUNITY): Payer: Managed Care, Other (non HMO)

## 2017-02-04 ENCOUNTER — Encounter (HOSPITAL_COMMUNITY): Payer: Self-pay | Admitting: Emergency Medicine

## 2017-02-04 DIAGNOSIS — J302 Other seasonal allergic rhinitis: Secondary | ICD-10-CM

## 2017-02-04 DIAGNOSIS — J301 Allergic rhinitis due to pollen: Secondary | ICD-10-CM | POA: Insufficient documentation

## 2017-02-04 DIAGNOSIS — Z79899 Other long term (current) drug therapy: Secondary | ICD-10-CM | POA: Insufficient documentation

## 2017-02-04 DIAGNOSIS — J4521 Mild intermittent asthma with (acute) exacerbation: Secondary | ICD-10-CM

## 2017-02-04 MED ORDER — ALBUTEROL SULFATE (2.5 MG/3ML) 0.083% IN NEBU
INHALATION_SOLUTION | RESPIRATORY_TRACT | Status: AC
  Filled 2017-02-04: qty 6

## 2017-02-04 MED ORDER — ALBUTEROL SULFATE (2.5 MG/3ML) 0.083% IN NEBU
5.0000 mg | INHALATION_SOLUTION | Freq: Once | RESPIRATORY_TRACT | Status: AC
  Administered 2017-02-04: 5 mg via RESPIRATORY_TRACT

## 2017-02-04 NOTE — ED Triage Notes (Signed)
C/o asthma flare-up with wheezing since last night.  Out of inhaler.

## 2017-02-05 MED ORDER — ALBUTEROL SULFATE HFA 108 (90 BASE) MCG/ACT IN AERS
2.0000 | INHALATION_SPRAY | RESPIRATORY_TRACT | Status: DC | PRN
  Administered 2017-02-05: 2 via RESPIRATORY_TRACT
  Filled 2017-02-05: qty 6.7

## 2017-02-05 MED ORDER — PREDNISONE 20 MG PO TABS: 40.0000 mg | ORAL_TABLET | Freq: Every day | ORAL | 0 refills | Status: DC

## 2017-02-05 MED ORDER — IPRATROPIUM-ALBUTEROL 0.5-2.5 (3) MG/3ML IN SOLN
3.0000 mL | Freq: Once | RESPIRATORY_TRACT | Status: AC
  Administered 2017-02-05: 3 mL via RESPIRATORY_TRACT
  Filled 2017-02-05: qty 3

## 2017-02-05 MED ORDER — CETIRIZINE HCL 10 MG PO TABS: 10.0000 mg | ORAL_TABLET | Freq: Every day | ORAL | 0 refills | Status: DC

## 2017-02-05 MED ORDER — PREDNISONE 20 MG PO TABS
60.0000 mg | ORAL_TABLET | Freq: Once | ORAL | Status: AC
  Administered 2017-02-05: 60 mg via ORAL
  Filled 2017-02-05: qty 3

## 2017-02-05 NOTE — Discharge Instructions (Signed)
Use inhaler 2 puffs every 4 hrs as needed. Take zyrtec daily for seasonal allergies. Take prednisone as prescribed until all gone. Follow up with family doctor as needed. Return if worsening.

## 2017-02-05 NOTE — ED Provider Notes (Signed)
[facility]-EMERGENCY DEPT Provider Note   CSN: [id number] Arrival date & time: 02/04/17  2246     History   Chief Complaint Chief Complaint  Patient presents with  . Asthma    HPI Stephen Andrews is a 21 y.o. male.  HPI Stephen Andrews is a 21 y.o. male with history of asthma, presents to emergency department complaining of asthma exacerbation. Patient states that he started having nasal congestion, watery and itchy eyes two days ago. Reports hx of seasonal allergies. Currently not taking any medications for this. States yesterday started to have dry cough and wheezing. States ran out of inhaler. Tried to sit in hot steamy room which helped some. Today symptoms seemed to be getting worse so he came to ED. Denies fever or chills. No chest pain. No sore throat. Currently no family doctor. No pain.   Past Medical History:  Diagnosis Date  . Asthma   . Back pain   . Testicular torsion     There are no active problems to display for this patient.   Past Surgical History:  Procedure Laterality Date  . SURGERY SCROTAL / TESTICULAR     testicular torsion surgery        Home Medications    Prior to Admission medications   Medication Sig Start Date End Date Taking? Authorizing Provider  albuterol (PROVENTIL HFA;VENTOLIN HFA) 108 (90 Base) MCG/ACT inhaler Inhale 1-2 puffs into the lungs every 6 (six) hours as needed for wheezing or shortness of breath. 01/08/17   Rise Mu, PA-C  predniSONE (DELTASONE) 20 MG tablet Take 2 tablets (40 mg total) by mouth daily with breakfast. 01/08/17   Rise Mu, PA-C    Family History No family history on file.  Social History Social History  Substance Use Topics  . Smoking status: Never Smoker  . Smokeless tobacco: Never Used  . Alcohol use Yes     Comment: socially      Allergies   Patient has no known allergies.   Review of Systems Review of Systems  Constitutional: Negative for chills and fever.  HENT: Positive  for congestion.   Eyes: Positive for discharge, redness and itching.  Respiratory: Positive for cough, chest tightness, shortness of breath and wheezing.   Cardiovascular: Negative for chest pain, palpitations and leg swelling.  Gastrointestinal: Negative for abdominal pain, diarrhea, nausea and vomiting.  Genitourinary: Negative for dysuria, frequency, hematuria and urgency.  Musculoskeletal: Negative for arthralgias, myalgias, neck pain and neck stiffness.  Skin: Negative for rash.  Allergic/Immunologic: Negative for immunocompromised state.  Neurological: Negative for dizziness, weakness, light-headedness, numbness and headaches.  All other systems reviewed and are negative.    Physical Exam Updated Vital Signs BP (!) 125/94 (BP Location: Right Arm)   Pulse 83   Temp 98.4 F (36.9 C) (Oral)   Resp 16   Ht  (1.727 m)   Wt 79.4 kg (175 lb)   SpO2 98%   BMI 26.61 kg/m   Physical Exam  Constitutional: He is oriented to person, place, and time. He appears well-developed and well-nourished. No distress.  HENT:  Head: Normocephalic and atraumatic.  Right Ear: External ear normal.  Left Ear: External ear normal.  Mouth/Throat: Oropharynx is clear and moist.  Clear rhinorrhea, pharynx erythemous, uvula midline  Eyes: Conjunctivae are normal.  Neck: Normal range of motion. Neck supple.  No meningeal signs  Cardiovascular: Normal rate, regular rhythm and normal heart sounds.   Pulmonary/Chest: Effort normal. No respiratory distress. He has wheezes.  He has no rales.  Mild end expiratory wheeze bilaterally  Abdominal: Soft. Bowel sounds are normal. There is no tenderness.  Musculoskeletal: He exhibits no edema or tenderness.  Lymphadenopathy:    He has no cervical adenopathy.  Neurological: He is alert and oriented to person, place, and time.  Skin: Skin is warm and dry. No erythema.  Psychiatric: He has a normal mood and affect.  Nursing note and vitals reviewed.    ED  Treatments / Results  Labs (all labs ordered are listed, but only abnormal results are displayed) Labs Reviewed - No data to display  EKG  EKG Interpretation None       Radiology Dg Chest 2 View  Result Date: 02/04/2017 CLINICAL DATA:  Initial evaluation for acute shortness of breath, wheezing. History of asthma. EXAM: CHEST  2 VIEW COMPARISON:  Prior radiograph from 01/08/2017. FINDINGS: Cardiac and mediastinal silhouette stable in size and contour, and remain within normal limits. Lungs are mildly hyperinflated. Scattered peribronchial thickening, likely related history of asthma. No focal infiltrates to suggest pneumonia. No pulmonary edema or pleural effusion. No pneumothorax. Osseous structures unremarkable. IMPRESSION: 1. Mild hyperinflation with diffuse peribronchial thickening, consistent with history of asthma. 2. No other active cardiopulmonary disease identified. Electronically Signed   By: Rise Mu M.D.   On: 02/04/2017 23:49    Procedures Procedures (including critical care time)  Medications Ordered in ED Medications  ipratropium-albuterol (DUONEB) 0.5-2.5 (3) MG/3ML nebulizer solution 3 mL (not administered)  predniSONE (DELTASONE) tablet 60 mg (not administered)  albuterol (PROVENTIL) (2.5 MG/3ML) 0.083% nebulizer solution 5 mg (5 mg Nebulization Given 02/04/17 2312)     Initial Impression / Assessment and Plan / ED Course  I have reviewed the triage vital signs and the nursing notes.  Pertinent labs & imaging results that were available during my care of the patient were reviewed by me and considered in my medical decision making (see chart for details).     Patient with seasonal allergies and wheezing since yesterday. Patient received one breathing treatment in triage and feels better. Still continues to wheeze on my exam. No accessory muscle use or respiratory distress. Speaking full sentences. Chest x-ray obtained by triage nurse and is normal  other than mild hyperinflation. Will give another breathing treatment and reassess.  12:37 AM Feels better. Ready for dc home. NAD. Eating a sandwich. VS normal. Plan to start on zyrtec for seasonal allergies. Prednisone burst. Inhaler provided in ED. Follow up as needed.   Vitals:   02/04/17 2302  BP: (!) 125/94  Pulse: 83  Resp: 16  Temp: 98.4 F (36.9 C)  TempSrc: Oral  SpO2: 98%  Weight: 79.4 kg (175 lb)  Height:  (1.727 m)     Final Clinical Impressions(s) / ED Diagnoses   Final diagnoses:  Mild intermittent asthma with exacerbation  Seasonal allergies    New Prescriptions New Prescriptions   No medications on file     Jaynie Crumble, PA-C 02/05/17 4098    Alvira Monday, MD 02/05/17 1418

## 2017-02-05 NOTE — ED Notes (Signed)
Pt complains of an asthma attack with wheezing. Pt reports the wheezing is not very bad but he can hear it. Pt reports it is more annoying.

## 2017-02-28 ENCOUNTER — Other Ambulatory Visit: Payer: Self-pay

## 2017-02-28 ENCOUNTER — Emergency Department (HOSPITAL_COMMUNITY)
Admission: EM | Admit: 2017-02-28 | Discharge: 2017-02-28 | Disposition: A | Discharge status: Home / Self Care | Payer: Managed Care, Other (non HMO) | Attending: Emergency Medicine | Admitting: Emergency Medicine

## 2017-02-28 ENCOUNTER — Encounter (HOSPITAL_COMMUNITY): Payer: Self-pay | Admitting: Emergency Medicine

## 2017-02-28 DIAGNOSIS — J4531 Mild persistent asthma with (acute) exacerbation: Secondary | ICD-10-CM | POA: Insufficient documentation

## 2017-02-28 MED ORDER — ALBUTEROL SULFATE HFA 108 (90 BASE) MCG/ACT IN AERS: 1.0000 | INHALATION_SPRAY | Freq: Four times a day (QID) | RESPIRATORY_TRACT | 0 refills | Status: DC | PRN

## 2017-02-28 MED ORDER — ALBUTEROL SULFATE HFA 108 (90 BASE) MCG/ACT IN AERS
2.0000 | INHALATION_SPRAY | Freq: Once | RESPIRATORY_TRACT | Status: AC
  Administered 2017-02-28: 2 via RESPIRATORY_TRACT
  Filled 2017-02-28: qty 6.7

## 2017-02-28 MED ORDER — IPRATROPIUM-ALBUTEROL 0.5-2.5 (3) MG/3ML IN SOLN
3.0000 mL | Freq: Once | RESPIRATORY_TRACT | Status: AC
  Administered 2017-02-28: 3 mL via RESPIRATORY_TRACT
  Filled 2017-02-28: qty 3

## 2017-02-28 MED ORDER — PREDNISONE 20 MG PO TABS: 40.0000 mg | ORAL_TABLET | Freq: Every day | ORAL | 0 refills | Status: DC

## 2017-02-28 MED ORDER — PREDNISONE 20 MG PO TABS
60.0000 mg | ORAL_TABLET | Freq: Once | ORAL | Status: AC
  Administered 2017-02-28: 60 mg via ORAL
  Filled 2017-02-28: qty 3

## 2017-02-28 NOTE — ED Triage Notes (Signed)
Pt c/o asthma since yesterday.  St's he is out of his inhaler.  Pt speaking in full sentences

## 2017-02-28 NOTE — ED Provider Notes (Signed)
[facility]   CSN: [id number] Arrival date & time: 02/28/17  0048     History   Chief Complaint Chief Complaint  Patient presents with  . Asthma    HPI Stephen Andrews is a 21 y.o. male.   21 year old male with a history of asthma presents to the emergency department for worsening shortness of breath and wheezing.  Symptoms have been persistent since yesterday, waxing and waning in severity.  He notes that he is out of his inhaler.  He was last seen for symptoms 1 month ago.  He has continue taking Zyrtec for seasonal allergy type symptoms.  No fevers, chest pain, sick contacts.      Past Medical History:  Diagnosis Date  . Asthma   . Back pain   . Testicular torsion     There are no active problems to display for this patient.   Past Surgical History:  Procedure Laterality Date  . SURGERY SCROTAL / TESTICULAR     testicular torsion surgery        Home Medications    Prior to Admission medications   Medication Sig Start Date End Date Taking? Authorizing Provider  albuterol (PROVENTIL HFA;VENTOLIN HFA) 108 (90 Base) MCG/ACT inhaler Inhale 1-2 puffs every 6 (six) hours as needed into the lungs for wheezing or shortness of breath. 02/28/17   Antony MaduraHumes, Atwood Adcock, PA-C  cetirizine (ZYRTEC ALLERGY) 10 MG tablet Take 1 tablet (10 mg total) by mouth daily. 02/05/17   Kirichenko, Tatyana, PA-C  predniSONE (DELTASONE) 20 MG tablet Take 2 tablets (40 mg total) daily by mouth. 02/28/17   Antony MaduraHumes, Alice Burnside, PA-C    Family History No family history on file.  Social History Social History   Tobacco Use  . Smoking status: Never Smoker  . Smokeless tobacco: Never Used  Substance Use Topics  . Alcohol use: Yes    Comment: socially   . Drug use: Yes    Frequency: 3.0 times per week    Types: Marijuana     Allergies   Patient has no known allergies.   Review of Systems Review of Systems Ten systems reviewed and are  negative for acute change, except as noted in the HPI.    Physical Exam Updated Vital Signs BP 125/80 (BP Location: Left Arm)   Pulse 77   Temp 97.6 F (36.4 C) (Oral)   Resp 18   Ht 5\' 8"  (1.727 m)   Wt 79.4 kg (175 lb)   SpO2 99%   BMI 26.61 kg/m   Physical Exam  Constitutional: He is oriented to person, place, and time. He appears well-developed and well-nourished. No distress.  Nontoxic and in NAD  HENT:  Head: Normocephalic and atraumatic.  Eyes: Conjunctivae and EOM are normal. No scleral icterus.  Neck: Normal range of motion.  Cardiovascular: Normal rate, regular rhythm and intact distal pulses.  Pulmonary/Chest: Effort normal. No stridor. No respiratory distress. He has wheezes.  Faint expiratory wheeze diffusely.  Musculoskeletal: Normal range of motion.  Neurological: He is alert and oriented to person, place, and time. He exhibits normal muscle tone. Coordination normal.  Skin: Skin is warm and dry. No rash noted. He is not diaphoretic. No erythema. No pallor.  Psychiatric: He has a normal mood and affect. His behavior is normal.  Nursing note and vitals reviewed.    ED Treatments / Results  Labs (all labs ordered are listed, but only abnormal results are displayed) Labs Reviewed - No data to  display  EKG  EKG Interpretation None       Radiology No results found.  Procedures Procedures (including critical care time)  Medications Ordered in ED Medications  predniSONE (DELTASONE) tablet 60 mg (60 mg Oral Given 02/28/17 0121)  ipratropium-albuterol (DUONEB) 0.5-2.5 (3) MG/3ML nebulizer solution 3 mL (3 mLs Nebulization Given 02/28/17 0122)  albuterol (PROVENTIL HFA;VENTOLIN HFA) 108 (90 Base) MCG/ACT inhaler 2 puff (2 puffs Inhalation Given 02/28/17 0123)    1:45 AM Lungs clear to auscultation on reassessment.  Patient states that he feels much better.   Initial Impression / Assessment and Plan / ED Course  I have reviewed the triage vital signs  and the nursing notes.  Pertinent labs & imaging results that were available during my care of the patient were reviewed by me and considered in my medical decision making (see chart for details).     Patient presenting for wheezing and SOB x 2 days; out of inhaler. Lung exam improved after nebulizer treatment. Prednisone given in the ED and pt will be discharged with 5 day burst. Pt states they are breathing at baseline. Pt has been instructed to continue using prescribed medications and to speak with PCP about today's exacerbation. Return precautions discussed and provided. Patient discharged in stable condition with no unaddressed concerns.   Final Clinical Impressions(s) / ED Diagnoses   Final diagnoses:  Mild persistent asthma with exacerbation    ED Discharge Orders        Ordered    predniSONE (DELTASONE) 20 MG tablet  Daily     02/28/17 0144    albuterol (PROVENTIL HFA;VENTOLIN HFA) 108 (90 Base) MCG/ACT inhaler  Every 6 hours PRN     02/28/17 0144       Antony MaduraHumes, Jonia Oakey, PA-C 02/28/17 0146    Shon BatonHorton, Courtney F, MD 02/28/17 [phone]

## 2017-03-27 ENCOUNTER — Encounter (HOSPITAL_COMMUNITY): Payer: Self-pay | Admitting: Emergency Medicine

## 2017-03-27 ENCOUNTER — Emergency Department (HOSPITAL_COMMUNITY)
Admission: EM | Admit: 2017-03-27 | Discharge: 2017-03-27 | Disposition: A | Discharge status: Home / Self Care | Payer: Self-pay | Attending: Emergency Medicine | Admitting: Emergency Medicine

## 2017-03-27 ENCOUNTER — Emergency Department (HOSPITAL_COMMUNITY): Payer: Self-pay

## 2017-03-27 ENCOUNTER — Other Ambulatory Visit: Payer: Self-pay

## 2017-03-27 DIAGNOSIS — Z5321 Procedure and treatment not carried out due to patient leaving prior to being seen by health care provider: Secondary | ICD-10-CM | POA: Insufficient documentation

## 2017-03-27 DIAGNOSIS — R0602 Shortness of breath: Secondary | ICD-10-CM | POA: Insufficient documentation

## 2017-03-27 MED ORDER — ALBUTEROL SULFATE (2.5 MG/3ML) 0.083% IN NEBU
5.0000 mg | INHALATION_SOLUTION | Freq: Once | RESPIRATORY_TRACT | Status: AC
  Administered 2017-03-27: 5 mg via RESPIRATORY_TRACT
  Filled 2017-03-27: qty 6

## 2017-03-27 MED ORDER — ALBUTEROL SULFATE (2.5 MG/3ML) 0.083% IN NEBU: 5.0000 mg | INHALATION_SOLUTION | Freq: Once | RESPIRATORY_TRACT | Status: DC

## 2017-03-27 NOTE — ED Triage Notes (Signed)
Patient with shortness of breath and wheezing upon arrival to ED.  Patient states that he lost his albuterol HFA.

## 2017-03-27 NOTE — ED Notes (Addendum)
Called pt name x3 to reassess vitals, no response from pt.

## 2017-03-27 NOTE — ED Notes (Signed)
Attempted to call for room, no answer from pt.

## 2017-03-31 DIAGNOSIS — R0602 Shortness of breath: Secondary | ICD-10-CM | POA: Insufficient documentation

## 2017-03-31 DIAGNOSIS — J45909 Unspecified asthma, uncomplicated: Secondary | ICD-10-CM | POA: Insufficient documentation

## 2017-03-31 DIAGNOSIS — R05 Cough: Secondary | ICD-10-CM | POA: Insufficient documentation

## 2017-03-31 DIAGNOSIS — R0981 Nasal congestion: Secondary | ICD-10-CM | POA: Insufficient documentation

## 2017-04-01 ENCOUNTER — Other Ambulatory Visit: Payer: Self-pay

## 2017-04-01 ENCOUNTER — Emergency Department (HOSPITAL_COMMUNITY): Payer: Self-pay

## 2017-04-01 ENCOUNTER — Encounter (HOSPITAL_COMMUNITY): Payer: Self-pay | Admitting: Emergency Medicine

## 2017-04-01 ENCOUNTER — Emergency Department (HOSPITAL_COMMUNITY)
Admission: EM | Admit: 2017-04-01 | Discharge: 2017-04-01 | Disposition: A | Discharge status: Home / Self Care | Payer: Self-pay | Attending: Emergency Medicine | Admitting: Emergency Medicine

## 2017-04-01 DIAGNOSIS — R0981 Nasal congestion: Secondary | ICD-10-CM

## 2017-04-01 LAB — CBC
HCT: 46.2 % (ref 39.0–52.0)
HEMOGLOBIN: 16.5 g/dL (ref 13.0–17.0)
MCH: 31.6 pg (ref 26.0–34.0)
MCHC: 35.7 g/dL (ref 30.0–36.0)
MCV: 88.5 fL (ref 78.0–100.0)
PLATELETS: 235 10*3/uL (ref 150–400)
RBC: 5.22 MIL/uL (ref 4.22–5.81)
RDW: 12.2 % (ref 11.5–15.5)
WBC: 5.4 10*3/uL (ref 4.0–10.5)

## 2017-04-01 LAB — I-STAT TROPONIN, ED: TROPONIN I, POC: 0 ng/mL (ref 0.00–0.08)

## 2017-04-01 LAB — BASIC METABOLIC PANEL
ANION GAP: 8 (ref 5–15)
BUN: 21 mg/dL — ABNORMAL HIGH (ref 6–20)
CALCIUM: 9 mg/dL (ref 8.9–10.3)
CHLORIDE: 105 mmol/L (ref 101–111)
CO2: 26 mmol/L (ref 22–32)
CREATININE: 1.55 mg/dL — AB (ref 0.61–1.24)
GFR calc non Af Amer: >60 mL/min (ref >60)
Glucose, Bld: 92 mg/dL (ref 65–99)
Potassium: 3.8 mmol/L (ref 3.5–5.1)
SODIUM: 139 mmol/L (ref 135–145)

## 2017-04-01 MED ORDER — FLUTICASONE PROPIONATE 50 MCG/ACT NA SUSP: 2.0000 | Freq: Every day | NASAL | 2 refills | Status: DC

## 2017-04-01 MED ORDER — ALBUTEROL SULFATE HFA 108 (90 BASE) MCG/ACT IN AERS
2.0000 | INHALATION_SPRAY | Freq: Once | RESPIRATORY_TRACT | Status: AC
  Administered 2017-04-01: 2 via RESPIRATORY_TRACT
  Filled 2017-04-01: qty 6.7

## 2017-04-01 MED ORDER — ACETAMINOPHEN 325 MG PO TABS
650.0000 mg | ORAL_TABLET | Freq: Once | ORAL | Status: AC
  Administered 2017-04-01: 650 mg via ORAL
  Filled 2017-04-01: qty 2

## 2017-04-01 MED ORDER — ALBUTEROL SULFATE (2.5 MG/3ML) 0.083% IN NEBU
5.0000 mg | INHALATION_SOLUTION | Freq: Once | RESPIRATORY_TRACT | Status: AC
  Administered 2017-04-01: 5 mg via RESPIRATORY_TRACT
  Filled 2017-04-01: qty 6

## 2017-04-01 MED ORDER — ALBUTEROL SULFATE HFA 108 (90 BASE) MCG/ACT IN AERS: 2.0000 | INHALATION_SPRAY | RESPIRATORY_TRACT | 1 refills | Status: DC | PRN

## 2017-04-01 NOTE — Discharge Instructions (Addendum)
Your symptoms are consistent with a viral illness. Viruses do not require antibiotics. Treatment is symptomatic care and it is important to note that these symptoms may last for 7-14 days.   Hand washing: Wash your hands throughout the day, but especially before and after touching the face, using the restroom, sneezing, coughing, or touching surfaces that have been coughed or sneezed upon. Hydration: Symptoms will be intensified and complicated by dehydration. Dehydration can also extend the duration of symptoms. Drink plenty of fluids and get plenty of rest. You should be drinking at least half a liter of water an hour to stay hydrated. Electrolyte drinks (ex. Gatorade, Powerade, Pedialyte) are also encouraged. You should be drinking enough fluids to make your urine light yellow, almost clear. If this is not the case, you are not drinking enough water. Please note that some of the treatments indicated below will not be effective if you are not adequately hydrated. Pain or fever: Ibuprofen, Naproxen, or Tylenol for pain or fever.  Albuterol: May use the albuterol as needed for instances of shortness of breath. Zyrtec or Claritin: May add these medication daily to control underlying symptoms of congestion, sneezing, and other signs of allergies. Flonase: Use this medication, as directed, for nasal and sinus congestion. Congestion: Plain Mucinex may help relieve congestion. Saline sinus rinses and saline nasal sprays may also help relieve congestion. If you do not have heart problems or an allergy to such medications, you may also try phenylephrine or Sudafed. Follow up: Follow up with a primary care provider, as needed, for any future management of this issue.

## 2017-04-01 NOTE — ED Notes (Signed)
Respiratory called for neb treatment.

## 2017-04-01 NOTE — ED Triage Notes (Signed)
Patient complaining of having some sob due to asthma. Patient also nasal congestion. Patient states this started about an hour ago having sob.
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
## 2017-04-01 NOTE — ED Notes (Signed)
Pt states asthma exacerbation. He is out of albuterol inhaler at home.

## 2017-05-18 ENCOUNTER — Emergency Department (HOSPITAL_COMMUNITY)
Admission: EM | Admit: 2017-05-18 | Discharge: 2017-05-18 | Disposition: A | Discharge status: Home / Self Care | Payer: Self-pay | Attending: Emergency Medicine | Admitting: Emergency Medicine

## 2017-05-18 ENCOUNTER — Other Ambulatory Visit: Payer: Self-pay

## 2017-05-18 ENCOUNTER — Emergency Department (HOSPITAL_COMMUNITY): Payer: Self-pay

## 2017-05-18 ENCOUNTER — Encounter (HOSPITAL_COMMUNITY): Payer: Self-pay | Admitting: Emergency Medicine

## 2017-05-18 DIAGNOSIS — J45901 Unspecified asthma with (acute) exacerbation: Secondary | ICD-10-CM | POA: Insufficient documentation

## 2017-05-18 DIAGNOSIS — Z79899 Other long term (current) drug therapy: Secondary | ICD-10-CM | POA: Insufficient documentation

## 2017-05-18 LAB — BASIC METABOLIC PANEL
Anion gap: 11 (ref 5–15)
BUN: 16 mg/dL (ref 6–20)
CHLORIDE: 105 mmol/L (ref 101–111)
CO2: 24 mmol/L (ref 22–32)
CREATININE: 1.32 mg/dL — AB (ref 0.61–1.24)
Calcium: 9.3 mg/dL (ref 8.9–10.3)
GFR calc Af Amer: >60 mL/min (ref >60)
GFR calc non Af Amer: >60 mL/min (ref >60)
GLUCOSE: 93 mg/dL (ref 65–99)
Potassium: 3.6 mmol/L (ref 3.5–5.1)
Sodium: 140 mmol/L (ref 135–145)

## 2017-05-18 LAB — CBC
HEMATOCRIT: 47.3 % (ref 39.0–52.0)
HEMOGLOBIN: 16.7 g/dL (ref 13.0–17.0)
MCH: 31.5 pg (ref 26.0–34.0)
MCHC: 35.3 g/dL (ref 30.0–36.0)
MCV: 89.2 fL (ref 78.0–100.0)
Platelets: 201 10*3/uL (ref 150–400)
RBC: 5.3 MIL/uL (ref 4.22–5.81)
RDW: 12.2 % (ref 11.5–15.5)
WBC: 6.3 10*3/uL (ref 4.0–10.5)

## 2017-05-18 MED ORDER — ALBUTEROL SULFATE HFA 108 (90 BASE) MCG/ACT IN AERS: 1.0000 | INHALATION_SPRAY | Freq: Four times a day (QID) | RESPIRATORY_TRACT | 0 refills | Status: DC | PRN

## 2017-05-18 MED ORDER — ALBUTEROL SULFATE (2.5 MG/3ML) 0.083% IN NEBU: 5.0000 mg | INHALATION_SOLUTION | Freq: Once | RESPIRATORY_TRACT | Status: DC

## 2017-05-18 MED ORDER — ALBUTEROL SULFATE (2.5 MG/3ML) 0.083% IN NEBU
INHALATION_SOLUTION | RESPIRATORY_TRACT | Status: AC
  Filled 2017-05-18: qty 6

## 2017-05-18 MED ORDER — ALBUTEROL SULFATE HFA 108 (90 BASE) MCG/ACT IN AERS
2.0000 | INHALATION_SPRAY | Freq: Once | RESPIRATORY_TRACT | Status: AC
  Administered 2017-05-18: 2 via RESPIRATORY_TRACT
  Filled 2017-05-18: qty 6.7

## 2017-05-18 MED ORDER — PREDNISONE 50 MG PO TABS: ORAL_TABLET | ORAL | 0 refills | Status: DC

## 2017-05-18 NOTE — ED Provider Notes (Signed)
[facility] Provider Note   CSN: [id number] Arrival date & time: 05/18/17  0257     History   Chief Complaint Chief Complaint  Patient presents with  . Asthma     HPI   Blood pressure 122/75, pulse 72, temperature 98.3 F (36.8 C), temperature source Oral, resp. rate 15, height 5\' 8"  (1.727 m), weight 81.6 kg (180 lb), SpO2 98 %.  Issaac Jeral PinchDowns is a 22 y.o. male complaining of shortness of breath and chest tightness onset at 2 AM, woke him from sleep, there is been no associated fevers, chills or cough.  He tried to administer his home inhaler but realized it was out.  No chest pain, increasing peripheral edema, recent trips or immobilizations.  Patient has received nebulizer treatment on my evaluation and states that he feels significantly better.  No primary care or insurance.  Past Medical History:  Diagnosis Date  . Asthma   . Back pain   . Testicular torsion     There are no active problems to display for this patient.   Past Surgical History:  Procedure Laterality Date  . SURGERY SCROTAL / TESTICULAR     testicular torsion surgery        Home Medications    Prior to Admission medications   Medication Sig Start Date End Date Taking? Authorizing Provider  albuterol (PROVENTIL HFA;VENTOLIN HFA) 108 (90 Base) MCG/ACT inhaler Inhale 1-2 puffs every 6 (six) hours as needed into the lungs for wheezing or shortness of breath. 02/28/17   Antony MaduraHumes, Kelly, PA-C  albuterol (PROVENTIL HFA;VENTOLIN HFA) 108 (90 Base) MCG/ACT inhaler Inhale 2 puffs into the lungs every 4 (four) hours as needed for wheezing or shortness of breath. 04/01/17   Joy, Shawn C, PA-C  albuterol (PROVENTIL HFA;VENTOLIN HFA) 108 (90 Base) MCG/ACT inhaler Inhale 1-2 puffs into the lungs every 6 (six) hours as needed for wheezing or shortness of breath. 05/18/17   Palumbo, April, MD  cetirizine (ZYRTEC ALLERGY) 10 MG tablet Take 1 tablet (10 mg total) by mouth  daily. Patient taking differently: Take 10 mg by mouth daily as needed for allergies.  02/05/17   Kirichenko, Tatyana, PA-C  fluticasone (FLONASE) 50 MCG/ACT nasal spray Place 2 sprays into both nostrils daily. 04/01/17   Joy, Hillard DankerShawn C, PA-C  predniSONE (DELTASONE) 50 MG tablet Take 1 tablet daily with breakfast 05/18/17   Demari Kropp, Mardella LaymanNicole, PA-C    Family History History reviewed. No pertinent family history.  Social History Social History   Tobacco Use  . Smoking status: Never Smoker  . Smokeless tobacco: Never Used  Substance Use Topics  . Alcohol use: Yes    Comment: socially   . Drug use: Yes    Frequency: 3.0 times per week    Types: Marijuana     Allergies   Patient has no known allergies.   Review of Systems Review of Systems  A complete review of systems was obtained and all systems are negative except as noted in the HPI and PMH.   Physical Exam Updated Vital Signs BP 122/75 (BP Location: Right Arm)   Pulse 72   Temp 98.3 F (36.8 C) (Oral)   Resp 15   Ht 5\' 8"  (1.727 m)   Wt 81.6 kg (180 lb)   SpO2 98%   BMI 27.37 kg/m   Physical Exam  Constitutional: He is oriented to person, place, and time. He appears well-developed and well-nourished. No distress.  HENT:  Head: Normocephalic and atraumatic.  Mouth/Throat: Oropharynx is clear and moist.  Eyes: Conjunctivae and EOM are normal. Pupils are equal, round, and reactive to light.  Neck: Normal range of motion.  Cardiovascular: Normal rate, regular rhythm and intact distal pulses.  Pulmonary/Chest: Effort normal and breath sounds normal. No stridor. No respiratory distress. He has no wheezes. He has no rales. He exhibits no tenderness.  Abdominal: Soft. There is no tenderness.  Musculoskeletal: Normal range of motion.  Neurological: He is alert and oriented to person, place, and time.  Skin: He is not diaphoretic.  Psychiatric: He has a normal mood and affect.  Nursing note and vitals reviewed.    ED  Treatments / Results  Labs (all labs ordered are listed, but only abnormal results are displayed) Labs Reviewed  BASIC METABOLIC PANEL - Abnormal; Notable for the following components:      Result Value   Creatinine, Ser 1.32 (*)    All other components within normal limits  CBC    EKG  EKG Interpretation None       Radiology Dg Chest 2 View  Result Date: 05/18/2017 CLINICAL DATA:  Shortness of breath EXAM: CHEST  2 VIEW COMPARISON:  04/01/2017 FINDINGS: Mild bronchitic changes. No acute consolidation or effusion. Normal cardiomediastinal silhouette. No pneumothorax. IMPRESSION: Mild bronchitic changes.  No focal pulmonary infiltrate. Electronically Signed   By: Jasmine Pang M.D.   On: 05/18/2017 04:00    Procedures Procedures (including critical care time)  Medications Ordered in ED Medications  albuterol (PROVENTIL) (2.5 MG/3ML) 0.083% nebulizer solution (not administered)  albuterol (PROVENTIL) (2.5 MG/3ML) 0.083% nebulizer solution 5 mg (5 mg Nebulization Not Given 05/18/17 0636)  albuterol (PROVENTIL HFA;VENTOLIN HFA) 108 (90 Base) MCG/ACT inhaler 2 puff (2 puffs Inhalation Given 05/18/17 9629)     Initial Impression / Assessment and Plan / ED Course  I have reviewed the triage vital signs and the nursing notes.  Pertinent labs & imaging results that were available during my care of the patient were reviewed by me and considered in my medical decision making (see chart for details).     Vitals:   05/18/17 0336 05/18/17 0337 05/18/17 0625  BP: (!) 139/95  122/75  Pulse: 85  72  Resp: (!) 22  15  Temp: 98.3 F (36.8 C)    TempSrc: Oral    SpO2: 97%  98%  Weight:  81.6 kg (180 lb)   Height:  5\' 8"  (1.727 m)     Medications  albuterol (PROVENTIL) (2.5 MG/3ML) 0.083% nebulizer solution (not administered)  albuterol (PROVENTIL) (2.5 MG/3ML) 0.083% nebulizer solution 5 mg (5 mg Nebulization Not Given 05/18/17 0636)  albuterol (PROVENTIL HFA;VENTOLIN HFA) 108 (90  Base) MCG/ACT inhaler 2 puff (2 puffs Inhalation Given 05/18/17 0637)    Turrell Severt is 22 y.o. male presenting with resolved shortness of breath, home inhaler had run out.  Chest x-ray without infiltrate, lung sounds clear on my reexamination.  Patient states he would like to leave the ED, he is tired.  Patient given albuterol inhaler, prednisone burst, referral for primary care, mild elevation in creatinine, advised him to push fluids and avoid NSAIDs.  Evaluation does not show pathology that would require ongoing emergent intervention or inpatient treatment. Pt is hemodynamically stable and mentating appropriately. Discussed findings and plan with patient/guardian, who agrees with care plan. All questions answered. Return precautions discussed and outpatient follow up given.      Final Clinical Impressions(s) / ED Diagnoses   Final diagnoses:  Exacerbation of asthma, unspecified  asthma severity, unspecified whether persistent    ED Discharge Orders        Ordered    predniSONE (DELTASONE) 50 MG tablet     05/18/17 0620    albuterol (PROVENTIL HFA;VENTOLIN HFA) 108 (90 Base) MCG/ACT inhaler  Every 6 hours PRN     05/18/17 0628       Chenille Toor, Mardella Layman 05/18/17 1610    Palumbo, April, MD 05/18/17 [phone]

## 2017-05-18 NOTE — Discharge Instructions (Signed)
Please follow with your primary care doctor in the next 2 days for a check-up. They must obtain records for further management.  ° °Do not hesitate to return to the Emergency Department for any new, worsening or concerning symptoms.  ° °

## 2017-05-18 NOTE — ED Triage Notes (Signed)
Pt reports shortness of breath starting this morning, hx asthma. Audible wheezing in triage.

## 2017-08-03 ENCOUNTER — Encounter (HOSPITAL_BASED_OUTPATIENT_CLINIC_OR_DEPARTMENT_OTHER): Payer: Self-pay | Admitting: *Deleted

## 2017-08-03 ENCOUNTER — Emergency Department (HOSPITAL_BASED_OUTPATIENT_CLINIC_OR_DEPARTMENT_OTHER)
Admission: EM | Admit: 2017-08-03 | Discharge: 2017-08-03 | Disposition: A | Discharge status: Home / Self Care | Payer: Self-pay | Attending: Emergency Medicine | Admitting: Emergency Medicine

## 2017-08-03 ENCOUNTER — Other Ambulatory Visit: Payer: Self-pay

## 2017-08-03 DIAGNOSIS — J4541 Moderate persistent asthma with (acute) exacerbation: Secondary | ICD-10-CM | POA: Insufficient documentation

## 2017-08-03 DIAGNOSIS — Z79899 Other long term (current) drug therapy: Secondary | ICD-10-CM | POA: Insufficient documentation

## 2017-08-03 MED ORDER — ALBUTEROL SULFATE HFA 108 (90 BASE) MCG/ACT IN AERS
1.0000 | INHALATION_SPRAY | Freq: Once | RESPIRATORY_TRACT | Status: AC
  Administered 2017-08-03: 2 via RESPIRATORY_TRACT
  Filled 2017-08-03: qty 6.7

## 2017-08-03 MED ORDER — BECLOMETHASONE DIPROP HFA 80 MCG/ACT IN AERB: 1.0000 | INHALATION_SPRAY | Freq: Two times a day (BID) | RESPIRATORY_TRACT | 0 refills | Status: DC

## 2017-08-03 MED ORDER — ALBUTEROL SULFATE (2.5 MG/3ML) 0.083% IN NEBU
2.5000 mg | INHALATION_SOLUTION | Freq: Once | RESPIRATORY_TRACT | Status: AC
  Administered 2017-08-03: 2.5 mg via RESPIRATORY_TRACT
  Filled 2017-08-03: qty 3

## 2017-08-03 MED ORDER — FLUTICASONE PROPIONATE HFA 44 MCG/ACT IN AERO
1.0000 | INHALATION_SPRAY | Freq: Once | RESPIRATORY_TRACT | Status: AC
  Administered 2017-08-03: 1 via RESPIRATORY_TRACT
  Filled 2017-08-03: qty 10.6

## 2017-08-03 MED ORDER — IPRATROPIUM-ALBUTEROL 0.5-2.5 (3) MG/3ML IN SOLN
3.0000 mL | Freq: Once | RESPIRATORY_TRACT | Status: AC
  Administered 2017-08-03: 3 mL via RESPIRATORY_TRACT
  Filled 2017-08-03: qty 3

## 2017-08-03 MED ORDER — CETIRIZINE HCL 10 MG PO TABS: 10.0000 mg | ORAL_TABLET | Freq: Every day | ORAL | 0 refills | Status: DC

## 2017-08-03 NOTE — Discharge Instructions (Addendum)
Please begin using the QVAR inhaler tonight. You will inhale 1 puff two times a day (morning and night). It is important to use this every single day, even if you are breathing normally. You can continue to use your albuterol inhaler as needed.  Also begin taking cetirizine (Zyrtec) one tablet daily to help with allergies. This can also help with your breathing.   Please call Monday to schedule an appointment with a primary doctor. The following two practices are accepting new patients: [facility] [phone] [facility] [phone]

## 2017-08-03 NOTE — ED Triage Notes (Signed)
Pt c/o SOb x 2 hrs HX asthma

## 2017-08-03 NOTE — ED Notes (Signed)
Pt verbalizes understanding of d/c instructions and denies any further need at this time. 

## 2017-08-03 NOTE — ED Provider Notes (Signed)
[facility] EMERGENCY DEPARTMENT Provider Note   CSN: [id number] Arrival date & time: 08/03/17  1908  History   Chief Complaint Chief Complaint  Patient presents with  . Asthma    HPI Stephen Andrews is a 22 y.o. male presenting with SOB. PMH significant for asthma.   HPI   Patient presents with SOB for the past 2 hours. Has history of asthma and is prescribed albuterol inhaler, however he used last puff of inhaler last night. Woke up last night around 3AM with difficulty breathing, and used last albuterol puff. Symptoms quickly resolved and he was able to go back to sleep. Began having sx again today at work, but as he did not have his inhaler, sx persisted, so he came to ED. Notes SOB in addition to wheezing. Endorses rhinorrhea as well as itchy and watery eyes which he attributes to allergies. Is not currently taking any allergy medication. Recently moved from [place] so does not have a PCP here.  Typically uses albuterol inhaler about 3 times per week, sometimes more than once per day. Nighttime awakening about once weekly.   Past Medical History:  Diagnosis Date  . Asthma   . Back pain   . Testicular torsion     There are no active problems to display for this patient.   Past Surgical History:  Procedure Laterality Date  . SURGERY SCROTAL / TESTICULAR     testicular torsion surgery         Home Medications    Prior to Admission medications   Medication Sig Start Date End Date Taking? Authorizing Provider  beclomethasone (QVAR REDIHALER) 80 MCG/ACT inhaler Inhale 1 puff into the lungs 2 (two) times daily. 08/03/17   Marquette SaaLancaster, Birda Didonato Joseph, MD  cetirizine (ZYRTEC ALLERGY) 10 MG tablet Take 1 tablet (10 mg total) by mouth daily. 08/03/17   Marquette SaaLancaster, Cincere Zorn Joseph, MD  fluticasone [facility](FLONASE) 50 MCG/ACT nasal spray Place 2 sprays into both nostrils daily. 04/01/17   Anselm PancoastJoy, Shawn C, PA-C    Family History History reviewed. No pertinent family history.  Social  History Social History   Tobacco Use  . Smoking status: Never Smoker  . Smokeless tobacco: Never Used  Substance Use Topics  . Alcohol use: Yes    Comment: socially   . Drug use: Not Currently    Frequency: 3.0 times per week     Allergies   Patient has no known allergies.   Review of Systems Review of Systems  Constitutional: Negative for fever.  HENT: Positive for rhinorrhea.   Eyes: Positive for discharge (Watery) and itching.  Respiratory: Positive for shortness of breath and wheezing. Negative for cough.   Cardiovascular: Negative for chest pain.     Physical Exam Updated Vital Signs BP 134/88   Pulse 72   Temp 98.1 F (36.7 C)   Resp 18   Ht 5\' 8"  (1.727 m)   Wt 81.6 kg (180 lb)   SpO2 100%   BMI 27.37 kg/m   Physical Exam  Constitutional: He is oriented to person, place, and time. He appears well-developed and well-nourished.  HENT:  Head: Normocephalic and atraumatic.  Nose: Nose normal.  Mouth/Throat: Oropharynx is clear and moist. No oropharyngeal exudate.  Eyes: Pupils are equal, round, and reactive to light. EOM are normal.  Conjunctival erythema bilaterally  Neck: Normal range of motion. Neck supple.  Cardiovascular: Normal rate, regular rhythm and normal heart sounds.  No murmur heard. Pulmonary/Chest: Effort normal. No respiratory distress. He has wheezes (  Faint in upper lobes). He has no rales.  Abdominal: Soft. Bowel sounds are normal. He exhibits no distension. There is no tenderness.  Musculoskeletal: Normal range of motion.  Lymphadenopathy:    He has no cervical adenopathy.  Neurological: He is alert and oriented to person, place, and time.  Skin: Skin is warm and dry.  Psychiatric: He has a normal mood and affect. His behavior is normal.  Nursing note and vitals reviewed.  ED Treatments / Results  Labs (all labs ordered are listed, but only abnormal results are displayed) Labs Reviewed - No data to  display  EKG None  Radiology No results found.  Procedures Procedures (including critical care time)  Medications Ordered in ED Medications  ipratropium-albuterol (DUONEB) 0.5-2.5 (3) MG/3ML nebulizer solution 3 mL (3 mLs Nebulization Given 08/03/17 1918)  albuterol (PROVENTIL) (2.5 MG/3ML) 0.083% nebulizer solution 2.5 mg (2.5 mg Nebulization Given 08/03/17 1918)  albuterol (PROVENTIL HFA;VENTOLIN HFA) 108 (90 Base) MCG/ACT inhaler 1-2 puff (2 puffs Inhalation Given 08/03/17 2037)     Initial Impression / Assessment and Plan / ED Course  I have reviewed the triage vital signs and the nursing notes.  Pertinent labs & imaging results that were available during my care of the patient were reviewed by me and considered in my medical decision making (see chart for details).     Patient presents with sx consistent with asthma exacerbation. Significantly improved after Duoneb and albuterol neb. Normal WOB on RA. Slight wheezing in upper lobes however good air movement. Given reported frequency of sx, patient meeting criteria for moderate persistent asthma. Will give albuterol inhaler for continued PRN use, and will prescribe QVAR for maintenance. Will also prescribe cetirizine. Encouraged to f/u with PCP, and given numbers of office to call to establish care. Stable for discharge home.   Final Clinical Impressions(s) / ED Diagnoses   Final diagnoses:  Moderate persistent asthma with exacerbation    ED Discharge Orders        Ordered    beclomethasone (QVAR REDIHALER) 80 MCG/ACT inhaler  2 times daily     08/03/17 2038    cetirizine (ZYRTEC ALLERGY) 10 MG tablet  Daily     08/03/17 2041     Tarri Abernethy, MD, MPH PGY-3 [facility] Pager [phone]    Marquette Saa, MD 08/03/17 4540    Gwyneth Sprout, MD 08/04/17 [phone]

## 2017-09-30 ENCOUNTER — Encounter (HOSPITAL_COMMUNITY): Payer: Self-pay | Admitting: Emergency Medicine

## 2017-09-30 ENCOUNTER — Other Ambulatory Visit: Payer: Self-pay

## 2017-09-30 ENCOUNTER — Emergency Department (HOSPITAL_COMMUNITY)
Admission: EM | Admit: 2017-09-30 | Discharge: 2017-09-30 | Disposition: A | Discharge status: Home / Self Care | Payer: Self-pay | Attending: Emergency Medicine | Admitting: Emergency Medicine

## 2017-09-30 ENCOUNTER — Emergency Department (HOSPITAL_BASED_OUTPATIENT_CLINIC_OR_DEPARTMENT_OTHER)
Admission: EM | Admit: 2017-09-30 | Discharge: 2017-09-30 | Disposition: A | Discharge status: Home / Self Care | Payer: Self-pay | Attending: Emergency Medicine | Admitting: Emergency Medicine

## 2017-09-30 ENCOUNTER — Encounter (HOSPITAL_BASED_OUTPATIENT_CLINIC_OR_DEPARTMENT_OTHER): Payer: Self-pay | Admitting: Emergency Medicine

## 2017-09-30 ENCOUNTER — Emergency Department (HOSPITAL_COMMUNITY): Payer: Self-pay

## 2017-09-30 DIAGNOSIS — J4541 Moderate persistent asthma with (acute) exacerbation: Secondary | ICD-10-CM | POA: Insufficient documentation

## 2017-09-30 DIAGNOSIS — R0602 Shortness of breath: Secondary | ICD-10-CM | POA: Insufficient documentation

## 2017-09-30 DIAGNOSIS — Z5321 Procedure and treatment not carried out due to patient leaving prior to being seen by health care provider: Secondary | ICD-10-CM | POA: Insufficient documentation

## 2017-09-30 DIAGNOSIS — Z79899 Other long term (current) drug therapy: Secondary | ICD-10-CM | POA: Insufficient documentation

## 2017-09-30 MED ORDER — PREDNISONE 50 MG PO TABS
60.0000 mg | ORAL_TABLET | Freq: Once | ORAL | Status: AC
  Administered 2017-09-30: 60 mg via ORAL
  Filled 2017-09-30: qty 1

## 2017-09-30 MED ORDER — ALBUTEROL SULFATE HFA 108 (90 BASE) MCG/ACT IN AERS
2.0000 | INHALATION_SPRAY | RESPIRATORY_TRACT | Status: DC | PRN
  Administered 2017-09-30: 2 via RESPIRATORY_TRACT
  Filled 2017-09-30: qty 6.7

## 2017-09-30 MED ORDER — IPRATROPIUM-ALBUTEROL 0.5-2.5 (3) MG/3ML IN SOLN
RESPIRATORY_TRACT | Status: AC
  Administered 2017-09-30: 3 mL via RESPIRATORY_TRACT
  Filled 2017-09-30: qty 3

## 2017-09-30 MED ORDER — ALBUTEROL SULFATE (2.5 MG/3ML) 0.083% IN NEBU
5.0000 mg | INHALATION_SOLUTION | Freq: Once | RESPIRATORY_TRACT | Status: AC
  Administered 2017-09-30: 5 mg via RESPIRATORY_TRACT
  Filled 2017-09-30: qty 6

## 2017-09-30 MED ORDER — PREDNISONE 20 MG PO TABS: 40.0000 mg | ORAL_TABLET | Freq: Every day | ORAL | 0 refills | Status: DC

## 2017-09-30 MED ORDER — IPRATROPIUM-ALBUTEROL 0.5-2.5 (3) MG/3ML IN SOLN
3.0000 mL | Freq: Four times a day (QID) | RESPIRATORY_TRACT | Status: DC
  Administered 2017-09-30: 3 mL via RESPIRATORY_TRACT

## 2017-09-30 MED ORDER — ALBUTEROL SULFATE (2.5 MG/3ML) 0.083% IN NEBU
2.5000 mg | INHALATION_SOLUTION | RESPIRATORY_TRACT | Status: AC
  Administered 2017-09-30: 2.5 mg via RESPIRATORY_TRACT

## 2017-09-30 MED ORDER — ALBUTEROL SULFATE (2.5 MG/3ML) 0.083% IN NEBU
INHALATION_SOLUTION | RESPIRATORY_TRACT | Status: AC
  Administered 2017-09-30: 2.5 mg via RESPIRATORY_TRACT
  Filled 2017-09-30: qty 3

## 2017-09-30 MED ORDER — IPRATROPIUM BROMIDE 0.02 % IN SOLN
0.5000 mg | Freq: Once | RESPIRATORY_TRACT | Status: AC
  Administered 2017-09-30: 0.5 mg via RESPIRATORY_TRACT
  Filled 2017-09-30: qty 2.5

## 2017-09-30 MED ORDER — ALBUTEROL SULFATE (2.5 MG/3ML) 0.083% IN NEBU
INHALATION_SOLUTION | RESPIRATORY_TRACT | Status: AC
  Filled 2017-09-30: qty 3

## 2017-09-30 MED ORDER — FLUTICASONE PROPIONATE HFA 44 MCG/ACT IN AERO
1.0000 | INHALATION_SPRAY | Freq: Two times a day (BID) | RESPIRATORY_TRACT | Status: DC
  Administered 2017-09-30: 1 via RESPIRATORY_TRACT
  Filled 2017-09-30: qty 10.6

## 2017-09-30 NOTE — ED Triage Notes (Signed)
SOB x 2 days. [facility], given a breathing tx and put back in the waiting room. Pt LWBS due to wait times. Inspiratory and expiratory wheezing in triage. RRT to triage. NAD

## 2017-09-30 NOTE — ED Triage Notes (Signed)
Reports increased SOB tonight.  Noted to have wheezing in all fields.  Usually uses albuterol inhaler at home but does not have an inhaler [place].

## 2017-09-30 NOTE — ED Provider Notes (Signed)
[facility] EMERGENCY DEPARTMENT Provider Note   CSN: [id number] Arrival date & time: 09/30/17  2034     History   Chief Complaint Chief Complaint  Patient presents with  . Shortness of Breath    HPI Stephen Andrews is a 22 y.o. male.  Patient with history of asthma presents the emergency department with worsening wheezing and shortness of breath.  Symptoms started 2 days ago.  [facility] was given a breathing treatment, but left after waiting for 3 hours.  Patient typically has an albuterol inhaler at home that he uses as needed however is out of medications.  He denies fevers, chest pain.  He does feel tight in his chest.  No recent cold symptoms or allergy symptoms.  Patient does not know what could have started this attack. The onset of this condition was acute. The course is constant. Aggravating factors: none. Alleviating factors: none.       Past Medical History:  Diagnosis Date  . Asthma   . Back pain   . Testicular torsion     There are no active problems to display for this patient.   Past Surgical History:  Procedure Laterality Date  . SURGERY SCROTAL / TESTICULAR     testicular torsion surgery         Home Medications    Prior to Admission medications   Medication Sig Start Date End Date Taking? Authorizing Provider  beclomethasone (QVAR REDIHALER) 80 MCG/ACT inhaler Inhale 1 puff into the lungs 2 (two) times daily. 08/03/17   Marquette SaaLancaster, Abigail Joseph, MD  cetirizine (ZYRTEC ALLERGY) 10 MG tablet Take 1 tablet (10 mg total) by mouth daily. 08/03/17   Marquette SaaLancaster, Abigail Joseph, MD  fluticasone [facility](FLONASE) 50 MCG/ACT nasal spray Place 2 sprays into both nostrils daily. 04/01/17   Anselm PancoastJoy, Shawn C, PA-C    Family History No family history on file.  Social History Social History   Tobacco Use  . Smoking status: Never Smoker  . Smokeless tobacco: Never Used  Substance Use Topics  . Alcohol use: Yes    Comment: socially   .  Drug use: Not Currently    Frequency: 3.0 times per week     Allergies   Patient has no known allergies.   Review of Systems Review of Systems  Constitutional: Negative for fever.  HENT: Negative for rhinorrhea and sore throat.   Eyes: Negative for redness.  Respiratory: Positive for chest tightness, shortness of breath and wheezing. Negative for cough.   Cardiovascular: Negative for chest pain.  Gastrointestinal: Negative for abdominal pain, diarrhea, nausea and vomiting.  Genitourinary: Negative for dysuria.  Musculoskeletal: Negative for myalgias.  Skin: Negative for rash.  Neurological: Negative for headaches.     Physical Exam Updated Vital Signs BP (!) 142/84 (BP Location: Left Arm)   Pulse 98   Temp 98.1 F (36.7 C) (Oral)   Resp 18   SpO2 96%   Physical Exam  Constitutional: He appears well-developed and well-nourished.  HENT:  Head: Normocephalic and atraumatic.  Eyes: Conjunctivae are normal. Right eye exhibits no discharge. Left eye exhibits no discharge.  Neck: Normal range of motion. Neck supple.  Cardiovascular: Normal rate, regular rhythm and normal heart sounds.  Pulmonary/Chest: Effort normal. No stridor. No respiratory distress. He has no decreased breath sounds. He has wheezes (Mild to moderate, all fields). He has no rhonchi. He has no rales.  Abdominal: Soft. There is no tenderness.  Neurological: He is alert.  Skin:  Skin is warm and dry.  Psychiatric: He has a normal mood and affect.  Nursing note and vitals reviewed.    ED Treatments / Results  Labs (all labs ordered are listed, but only abnormal results are displayed) Labs Reviewed - No data to display  EKG None  Radiology Dg Chest 2 View  Result Date: 09/30/2017 CLINICAL DATA:  Increase shortness-of-breath and wheezing. EXAM: CHEST - 2 VIEW COMPARISON:  05/18/2017 FINDINGS: Lungs are adequately inflated without focal airspace consolidation or effusion. Cardiomediastinal silhouette,  bones and soft tissues are within normal. IMPRESSION: No active cardiopulmonary disease. Electronically Signed   By: Elberta Fortis M.D.   On: 09/30/2017 02:57    Procedures Procedures (including critical care time)  Medications Ordered in ED Medications  ipratropium-albuterol (DUONEB) 0.5-2.5 (3) MG/3ML nebulizer solution 3 mL (3 mLs Nebulization Given 09/30/17 2045)  fluticasone (FLOVENT HFA) 44 MCG/ACT inhaler 1 puff (1 puff Inhalation Given 09/30/17 2153)  albuterol (PROVENTIL HFA;VENTOLIN HFA) 108 (90 Base) MCG/ACT inhaler 2 puff (2 puffs Inhalation Given 09/30/17 2153)  predniSONE (DELTASONE) tablet 60 mg (60 mg Oral Given 09/30/17 2105)  albuterol (PROVENTIL) (2.5 MG/3ML) 0.083% nebulizer solution 2.5 mg (2.5 mg Nebulization Given 09/30/17 2045)  albuterol (PROVENTIL) (2.5 MG/3ML) 0.083% nebulizer solution 5 mg (5 mg Nebulization Given 09/30/17 2118)  ipratropium (ATROVENT) nebulizer solution 0.5 mg (0.5 mg Nebulization Given 09/30/17 2118)     Initial Impression / Assessment and Plan / ED Course  I have reviewed the triage vital signs and the nursing notes.  Pertinent labs & imaging results that were available during my care of the patient were reviewed by me and considered in my medical decision making (see chart for details).     Patient seen and examined. Work-up initiated. Medications ordered.   Vital signs reviewed and are as follows: BP (!) 142/84 (BP Location: Left Arm)   Pulse 98   Temp 98.1 F (36.7 C) (Oral)   Resp 18   SpO2 98%   Patient much improved after 2 breathing treatments.  Lungs are clear.  He states that he feels much better.  Patient will be discharged home with prescription for 4 additional days of prednisone.  Patient provided with albuterol inhaler as well as Flovent inhaler.  Patient instructed on its use by RT.  Final Clinical Impressions(s) / ED Diagnoses   Final diagnoses:  Moderate persistent asthma with exacerbation   Patient with asthma, frequent  emergency departments for the same.  He is currently out of his medications.  Would likely benefit from controller medication.  No signs of infection; patient does not have fever or cough.  ENT exam is normal.  Lungs are clear after treatment.  Discharged to home.  ED Discharge Orders        Ordered    predniSONE (DELTASONE) 20 MG tablet  Daily     09/30/17 2051       Renne Crigler, PA-C 09/30/17 2159    Derwood Kaplan, MD 09/30/17 2333

## 2017-09-30 NOTE — Discharge Instructions (Signed)
Please read and follow all provided instructions.  Your diagnoses today include:  1. Moderate persistent asthma with exacerbation     Tests performed today include:  Vital signs. See below for your results today.   Medications prescribed:   Flovent - inhaler to help control asthma attacks, use twice a day   Albuterol inhaler - medication that opens up your airway  Use inhaler as follows: 1-2 puffs with spacer every 4 hours as needed for wheezing, cough, or shortness of breath.    Prednisone - steroid medicine   It is best to take this medication in the morning to prevent sleeping problems. If you are diabetic, monitor your blood sugar closely and stop taking Prednisone if blood sugar is over 300. Take with food to prevent stomach upset.   Take any prescribed medications only as directed.  Home care instructions:  Follow any educational materials contained in this packet.  Follow-up instructions: Please follow-up with your primary care provider in the next 3 days for further evaluation of your symptoms and management of your asthma.  Return instructions:   Please return to the Emergency Department if you experience worsening symptoms.  Please return with worsening wheezing, shortness of breath, or difficulty breathing.  Return with persistent fever above 101F.   Please return if you have any other emergent concerns.  Additional Information:  Your vital signs today were: BP (!) 142/84 (BP Location: Left Arm)    Pulse 98    Temp 98.1 F (36.7 C) (Oral)    Resp 18    SpO2 100%  If your blood pressure (BP) was elevated above 135/85 this visit, please have this repeated by your doctor within one month. --------------

## 2017-09-30 NOTE — ED Notes (Signed)
No response in lobby

## 2017-11-16 ENCOUNTER — Emergency Department (HOSPITAL_COMMUNITY)
Admission: EM | Admit: 2017-11-16 | Discharge: 2017-11-16 | Disposition: A | Discharge status: Home / Self Care | Payer: Self-pay | Attending: Emergency Medicine | Admitting: Emergency Medicine

## 2017-11-16 ENCOUNTER — Other Ambulatory Visit: Payer: Self-pay

## 2017-11-16 ENCOUNTER — Encounter (HOSPITAL_COMMUNITY): Payer: Self-pay | Admitting: Emergency Medicine

## 2017-11-16 DIAGNOSIS — J4520 Mild intermittent asthma, uncomplicated: Secondary | ICD-10-CM

## 2017-11-16 DIAGNOSIS — Z79899 Other long term (current) drug therapy: Secondary | ICD-10-CM | POA: Insufficient documentation

## 2017-11-16 MED ORDER — ALBUTEROL SULFATE HFA 108 (90 BASE) MCG/ACT IN AERS
2.0000 | INHALATION_SPRAY | RESPIRATORY_TRACT | Status: DC | PRN
  Administered 2017-11-16: 2 via RESPIRATORY_TRACT
  Filled 2017-11-16: qty 6.7

## 2017-11-16 MED ORDER — ALBUTEROL SULFATE (2.5 MG/3ML) 0.083% IN NEBU
5.0000 mg | INHALATION_SOLUTION | Freq: Once | RESPIRATORY_TRACT | Status: AC
  Administered 2017-11-16: 5 mg via RESPIRATORY_TRACT
  Filled 2017-11-16: qty 6

## 2017-11-16 NOTE — ED Triage Notes (Signed)
Patient complaining he ran out of his inhaler. Patient is wheezing.

## 2017-11-16 NOTE — ED Provider Notes (Signed)
[facility]-EMERGENCY DEPT Provider Note   CSN: [id number] Arrival date & time: 11/16/17  0136     History   Chief Complaint Chief Complaint  Patient presents with  . Asthma    HPI Will Jeral PinchDowns is a 22 y.o. male.  Patient history of asthma presents with wheezing that started around 6:00 pm yesterday (11/15/17). No fever. He reports symptoms feel like asthma and he is out of his inhaler. No vomiting, pain, congestion.   The history is provided by the patient. No language interpreter was used.  Asthma     Past Medical History:  Diagnosis Date  . Asthma   . Back pain   . Testicular torsion     There are no active problems to display for this patient.   Past Surgical History:  Procedure Laterality Date  . SURGERY SCROTAL / TESTICULAR     testicular torsion surgery         Home Medications    Prior to Admission medications   Medication Sig Start Date End Date Taking? Authorizing Provider  beclomethasone (QVAR REDIHALER) 80 MCG/ACT inhaler Inhale 1 puff into the lungs 2 (two) times daily. 08/03/17   Marquette SaaLancaster, Abigail Joseph, MD  cetirizine (ZYRTEC ALLERGY) 10 MG tablet Take 1 tablet (10 mg total) by mouth daily. 08/03/17   Marquette SaaLancaster, Abigail Joseph, MD  fluticasone [facility](FLONASE) 50 MCG/ACT nasal spray Place 2 sprays into both nostrils daily. 04/01/17   Joy, Shawn C, PA-C  predniSONE (DELTASONE) 20 MG tablet Take 2 tablets (40 mg total) by mouth daily. 09/30/17   Renne CriglerGeiple, Joshua, PA-C    Family History History reviewed. No pertinent family history.  Social History Social History   Tobacco Use  . Smoking status: Never Smoker  . Smokeless tobacco: Never Used  Substance Use Topics  . Alcohol use: Yes    Comment: socially   . Drug use: Not Currently    Frequency: 3.0 times per week     Allergies   Patient has no known allergies.   Review of Systems Review of Systems  Constitutional: Negative for fever.  HENT: Negative for congestion.     Respiratory: Positive for cough and wheezing.   Gastrointestinal: Negative for vomiting.  Musculoskeletal: Negative for myalgias.  Neurological: Negative for light-headedness.     Physical Exam Updated Vital Signs BP 124/80 (BP Location: Left Arm)   Pulse 67   Temp 98.3 F (36.8 C) (Oral)   Resp 17   Ht 5\' 8"  (1.727 m)   Wt 85.7 kg (189 lb)   SpO2 98%   BMI 28.74 kg/m   Physical Exam  Constitutional: He is oriented to person, place, and time. He appears well-developed and well-nourished.  HENT:  Head: Normocephalic.  Neck: Normal range of motion. Neck supple.  Cardiovascular: Normal rate and regular rhythm.  Pulmonary/Chest: Effort normal and breath sounds normal. He has no wheezes. He has no rales.  Patient examined after nebulizer treatment x 1. No wheezing on auscultation.  Abdominal: Soft. Bowel sounds are normal. There is no tenderness. There is no rebound and no guarding.  Musculoskeletal: Normal range of motion.  Neurological: He is alert and oriented to person, place, and time.  Skin: Skin is warm and dry. No rash noted.  Psychiatric: He has a normal mood and affect.     ED Treatments / Results  Labs (all labs ordered are listed, but only abnormal results are displayed) Labs Reviewed - No data to display  EKG None  Radiology No  results found.  Procedures Procedures (including critical care time)  Medications Ordered in ED Medications  albuterol (PROVENTIL) (2.5 MG/3ML) 0.083% nebulizer solution 5 mg (5 mg Nebulization Given 11/16/17 0211)     Initial Impression / Assessment and Plan / ED Course  I have reviewed the triage vital signs and the nursing notes.  Pertinent labs & imaging results that were available during my care of the patient were reviewed by me and considered in my medical decision making (see chart for details).     Patient presents with wheezing starting last night like his previous asthma exacerbations. No inhaler at home to use  so he presented to ED.   VSS, no hypoxia or tachpnea prior to nebulizer. No wheezing after nebulizer. He states he feels ready to go home.   Final Clinical Impressions(s) / ED Diagnoses   Final diagnoses:  None   1. Asthma, mild  ED Discharge Orders    None       Danne Harbor 11/16/17 0255    Azalia Bilis, MD 11/16/17 [phone]

## 2017-11-26 ENCOUNTER — Encounter (HOSPITAL_BASED_OUTPATIENT_CLINIC_OR_DEPARTMENT_OTHER): Payer: Self-pay | Admitting: *Deleted

## 2017-11-26 ENCOUNTER — Other Ambulatory Visit: Payer: Self-pay

## 2017-11-26 ENCOUNTER — Emergency Department (HOSPITAL_BASED_OUTPATIENT_CLINIC_OR_DEPARTMENT_OTHER)
Admission: EM | Admit: 2017-11-26 | Discharge: 2017-11-26 | Disposition: A | Discharge status: Home / Self Care | Payer: Self-pay | Attending: Emergency Medicine | Admitting: Emergency Medicine

## 2017-11-26 DIAGNOSIS — R0981 Nasal congestion: Secondary | ICD-10-CM | POA: Insufficient documentation

## 2017-11-26 DIAGNOSIS — J4541 Moderate persistent asthma with (acute) exacerbation: Secondary | ICD-10-CM | POA: Insufficient documentation

## 2017-11-26 MED ORDER — ALBUTEROL SULFATE HFA 108 (90 BASE) MCG/ACT IN AERS: 2.0000 | INHALATION_SPRAY | RESPIRATORY_TRACT | 0 refills | Status: DC | PRN

## 2017-11-26 MED ORDER — ALBUTEROL SULFATE (2.5 MG/3ML) 0.083% IN NEBU
2.5000 mg | INHALATION_SOLUTION | Freq: Once | RESPIRATORY_TRACT | Status: AC
  Administered 2017-11-26: 2.5 mg via RESPIRATORY_TRACT
  Filled 2017-11-26: qty 3

## 2017-11-26 MED ORDER — FLUTICASONE PROPIONATE 50 MCG/ACT NA SUSP: 2.0000 | Freq: Every day | NASAL | 2 refills | Status: AC

## 2017-11-26 MED ORDER — IPRATROPIUM-ALBUTEROL 0.5-2.5 (3) MG/3ML IN SOLN
3.0000 mL | Freq: Once | RESPIRATORY_TRACT | Status: AC
  Administered 2017-11-26: 3 mL via RESPIRATORY_TRACT
  Filled 2017-11-26: qty 3

## 2017-11-26 MED ORDER — DEXAMETHASONE SODIUM PHOSPHATE 10 MG/ML IJ SOLN
10.0000 mg | Freq: Once | INTRAMUSCULAR | Status: AC
  Administered 2017-11-26: 10 mg via INTRAMUSCULAR
  Filled 2017-11-26: qty 1

## 2017-11-26 MED ORDER — BECLOMETHASONE DIPROP HFA 80 MCG/ACT IN AERB: 1.0000 | INHALATION_SPRAY | Freq: Two times a day (BID) | RESPIRATORY_TRACT | 0 refills | Status: DC

## 2017-11-26 MED ORDER — KETOROLAC TROMETHAMINE 30 MG/ML IJ SOLN
30.0000 mg | Freq: Once | INTRAMUSCULAR | Status: AC
  Administered 2017-11-26: 30 mg via INTRAMUSCULAR
  Filled 2017-11-26: qty 1

## 2017-11-26 NOTE — ED Notes (Signed)
Pt ambulated with pulse ox. Sats 90-91%. MD notified.

## 2017-11-26 NOTE — Discharge Instructions (Signed)
You were seen today for upper respiratory symptoms and acute exacerbation of your asthma.  You likely need to restart your daily Qvar given your recent exacerbations.  Use Flonase for your nasal congestion.  You were given steroids here.  You do not require steroids at discharge.  Use your inhaler every 4 hours as needed.

## 2017-11-26 NOTE — ED Triage Notes (Addendum)
C/o cough fever and chills that started on Sunday. Hx of asthma and used his breathing treatment and felt this was not helping him. C/o chills. Pt arrives in no distress. Resp even and unlabored.  RT at bedside for evaluation.

## 2017-11-26 NOTE — ED Provider Notes (Signed)
[facility] EMERGENCY DEPARTMENT Provider Note   CSN: [id number] Arrival date & time: 11/26/17  0125     History   Chief Complaint Chief Complaint  Patient presents with  . cough and congestion    HPI Stephen Andrews is a 22 y.o. male.  HPI  This is a 22 year old male with a history of asthma who presents with congestion and asthma exacerbation.  Patient reports that he in general has had upper respiratory symptoms including congestion and chills.  He states "I think it is setting of my asthma."  He states that his inhaler helped him some but tonight did not seem to be helping.  He reports dry cough.  No documented fevers at home.  Reports nasal congestion.  No ear pain or sore throat.  Reports no prior hospitalizations for asthma.  He was recently seen in the ED and went home with a course of prednisone.  He reports chest congestion and pressure.  He is not taking his daily Qvar.  Past Medical History:  Diagnosis Date  . Asthma   . Back pain   . Testicular torsion     There are no active problems to display for this patient.   Past Surgical History:  Procedure Laterality Date  . SURGERY SCROTAL / TESTICULAR     testicular torsion surgery         Home Medications    Prior to Admission medications   Medication Sig Start Date End Date Taking? Authorizing Provider  albuterol (PROVENTIL HFA;VENTOLIN HFA) 108 (90 Base) MCG/ACT inhaler Inhale 2 puffs into the lungs every 4 (four) hours as needed for wheezing or shortness of breath. 11/26/17   Koven Belinsky, Mayer Maskerourtney F, MD  beclomethasone (QVAR REDIHALER) 80 MCG/ACT inhaler Inhale 1 puff into the lungs 2 (two) times daily. 11/26/17   Edison Nicholson, Mayer Maskerourtney F, MD  cetirizine (ZYRTEC ALLERGY) 10 MG tablet Take 1 tablet (10 mg total) by mouth daily. 08/03/17   Marquette SaaLancaster, Abigail Joseph, MD  fluticasone [facility](FLONASE) 50 MCG/ACT nasal spray Place 2 sprays into both nostrils daily. 11/26/17   Amonda Brillhart, Mayer Maskerourtney F, MD  predniSONE (DELTASONE) 20 MG  tablet Take 2 tablets (40 mg total) by mouth daily. 09/30/17   Renne CriglerGeiple, Joshua, PA-C    Family History No family history on file.  Social History Social History   Tobacco Use  . Smoking status: Never Smoker  . Smokeless tobacco: Never Used  Substance Use Topics  . Alcohol use: Yes    Comment: socially   . Drug use: Not Currently    Frequency: 3.0 times per week     Allergies   Patient has no known allergies.   Review of Systems Review of Systems  Constitutional: Positive for chills. Negative for fever.  HENT: Positive for congestion. Negative for sore throat.   Respiratory: Positive for cough, chest tightness and shortness of breath.   Cardiovascular: Negative for chest pain and leg swelling.  Gastrointestinal: Negative for abdominal pain, nausea and vomiting.  Neurological: Positive for headaches.  All other systems reviewed and are negative.    Physical Exam Updated Vital Signs BP (!) 129/99 (BP Location: Right Arm)   Pulse (!) 105   Temp 99 F (37.2 C) (Oral)   Resp 18   Ht 5\' 8"  (1.727 m)   Wt 88.5 kg (195 lb)   SpO2 96%   BMI 29.65 kg/m   Physical Exam  Constitutional: He is oriented to person, place, and time. He appears well-developed and well-nourished.  Nontoxic-appearing  HENT:  Head: Normocephalic and atraumatic.  Mouth/Throat: Oropharynx is clear and moist. No oropharyngeal exudate.  Nasal congestion noted  Eyes: Pupils are equal, round, and reactive to light.  Neck: Neck supple.  Cardiovascular: Regular rhythm and normal heart sounds.  No murmur heard. Tachycardia  Pulmonary/Chest: Effort normal. No respiratory distress. He has wheezes.  Fair air movement with expiratory wheezing noted  Abdominal: Soft. Bowel sounds are normal. There is no tenderness. There is no rebound.  Musculoskeletal: He exhibits no edema.  Neurological: He is alert and oriented to person, place, and time.  Skin: Skin is warm and dry.  Psychiatric: He has a normal mood  and affect.  Nursing note and vitals reviewed.    ED Treatments / Results  Labs (all labs ordered are listed, but only abnormal results are displayed) Labs Reviewed - No data to display  EKG None  Radiology No results found.  Procedures Procedures (including critical care time)  Medications Ordered in ED Medications  ipratropium-albuterol (DUONEB) 0.5-2.5 (3) MG/3ML nebulizer solution 3 mL (3 mLs Nebulization Given 11/26/17 0146)  albuterol (PROVENTIL) (2.5 MG/3ML) 0.083% nebulizer solution 2.5 mg (2.5 mg Nebulization Given 11/26/17 0146)  dexamethasone (DECADRON) injection 10 mg (10 mg Intramuscular Given 11/26/17 0205)  ketorolac (TORADOL) 30 MG/ML injection 30 mg (30 mg Intramuscular Given 11/26/17 0205)  ipratropium-albuterol (DUONEB) 0.5-2.5 (3) MG/3ML nebulizer solution 3 mL (3 mLs Nebulization Given 11/26/17 0229)     Initial Impression / Assessment and Plan / ED Course  I have reviewed the triage vital signs and the nursing notes.  Pertinent labs & imaging results that were available during my care of the patient were reviewed by me and considered in my medical decision making (see chart for details).  Clinical Course as of Nov 26 300  Sheral Flow Nov 26, 2017  0229 Some improvement after DuoNeb.  Continues to have some expiratory wheezing.  Repeat DuoNeb ordered.   [CH]  0300 After second DuoNeb, patient states that he continues to feel somewhat better.  He continues to have scant expiratory wheezing.  He ambulated and maintain pulse ox greater than 90%.   [CH]    Clinical Course User Index [CH] Casondra Gasca, Mayer Masker, MD    Patient presents with upper respiratory symptoms and shortness of breath.  He is overall nontoxic-appearing.  Afebrile.  Mildly tachycardic.  O2 sats 96%.  No respiratory distress.  He has fair air movement with wheezing on exam.  No focal rales or rhonchi.  Suspect viral URI causing acute asthma exacerbation.  He has not had any recent hospitalizations but was  seen in the ED.  Patient was given IM Decadron.  He was given DuoNeb x2.  See clinical course above.  Given that he is afebrile and pulmonary exam is only significant for wheezing, do not feel he needs a chest x-ray at this time.  Doubt pneumonia.  Patient was able to ambulate and maintain pulse ox after DuoNeb and stated he felt much better.  Patient would likely benefit from reinitiating Qvar daily.  Additionally, albuterol as needed every 4 hours.  He was provided with [facility] follow-up.  After history, exam, and medical workup I feel the patient has been appropriately medically screened and is safe for discharge home. Pertinent diagnoses were discussed with the patient. Patient was given return precautions.   Final Clinical Impressions(s) / ED Diagnoses   Final diagnoses:  Moderate persistent asthma with acute exacerbation  Nasal congestion    ED Discharge Orders  Ordered    beclomethasone (QVAR REDIHALER) 80 MCG/ACT inhaler  2 times daily     11/26/17 0301    albuterol (PROVENTIL HFA;VENTOLIN HFA) 108 (90 Base) MCG/ACT inhaler  Every 4 hours PRN     11/26/17 0301    fluticasone (FLONASE) 50 MCG/ACT nasal spray  Daily     11/26/17 0302       Shon Baton, MD 11/26/17 [phone]

## 2018-01-28 ENCOUNTER — Encounter (HOSPITAL_COMMUNITY): Payer: Self-pay

## 2018-01-28 ENCOUNTER — Emergency Department (HOSPITAL_COMMUNITY)
Admission: EM | Admit: 2018-01-28 | Discharge: 2018-01-28 | Disposition: A | Discharge status: Home / Self Care | Payer: Self-pay | Attending: Emergency Medicine | Admitting: Emergency Medicine

## 2018-01-28 ENCOUNTER — Other Ambulatory Visit: Payer: Self-pay

## 2018-01-28 DIAGNOSIS — W57XXXA Bitten or stung by nonvenomous insect and other nonvenomous arthropods, initial encounter: Secondary | ICD-10-CM | POA: Insufficient documentation

## 2018-01-28 DIAGNOSIS — Y939 Activity, unspecified: Secondary | ICD-10-CM | POA: Insufficient documentation

## 2018-01-28 DIAGNOSIS — J45909 Unspecified asthma, uncomplicated: Secondary | ICD-10-CM | POA: Insufficient documentation

## 2018-01-28 DIAGNOSIS — Y929 Unspecified place or not applicable: Secondary | ICD-10-CM | POA: Insufficient documentation

## 2018-01-28 DIAGNOSIS — S60561A Insect bite (nonvenomous) of right hand, initial encounter: Secondary | ICD-10-CM | POA: Insufficient documentation

## 2018-01-28 DIAGNOSIS — Y999 Unspecified external cause status: Secondary | ICD-10-CM | POA: Insufficient documentation

## 2018-01-28 MED ORDER — DIPHENHYDRAMINE HCL 25 MG PO TABS: 25.0000 mg | ORAL_TABLET | Freq: Four times a day (QID) | ORAL | 0 refills | Status: DC

## 2018-01-28 NOTE — ED Provider Notes (Signed)
[facility] Emergency Department Provider Note MRN:  [id number]  Arrival date & time: 01/28/18     Chief Complaint   Hand Swelling   History of Present Illness   Stephen Andrews is a 22 y.o. year-old male with no pertinent past medical history presenting to the ED with chief complaint of hand swelling.  Patient was helping a friend who was car ran into a ditch.  Was maneuvering through a bush when he experienced a sudden stinging pain to his right hand as well as his right flank.  This occurred 2 days ago, has been having continued burning sensation and redness to these areas.  No fever, no shortness of breath, no other injuries, no vomiting or diarrhea.  His mother wanted him to come to the emergency department to make sure that there is nothing serious going on.  Review of Systems  A complete 10 system review of systems was obtained and all systems are negative except as noted in the HPI and PMH.   Patient's Health History    Past Medical History:  Diagnosis Date  . Asthma   . Back pain   . Testicular torsion     Past Surgical History:  Procedure Laterality Date  . SURGERY SCROTAL / TESTICULAR     testicular torsion surgery     History reviewed. No pertinent family history.  Social History   Socioeconomic History  . Marital status: Single    Spouse name: Not on file  . Number of children: Not on file  . Years of education: Not on file  . Highest education level: Not on file  Occupational History  . Not on file  Social Needs  . Financial resource strain: Not on file  . Food insecurity:    Worry: Not on file    Inability: Not on file  . Transportation needs:    Medical: Not on file    Non-medical: Not on file  Tobacco Use  . Smoking status: Never Smoker  . Smokeless tobacco: Never Used  Substance and Sexual Activity  . Alcohol use: Yes    Comment: socially   . Drug use: Not Currently    Frequency: 3.0 times per week  . Sexual activity: Not on  file  Lifestyle  . Physical activity:    Days per week: Not on file    Minutes per session: Not on file  . Stress: Not on file  Relationships  . Social connections:    Talks on phone: Not on file    Gets together: Not on file    Attends religious service: Not on file    Active member of club or organization: Not on file    Attends meetings of clubs or organizations: Not on file    Relationship status: Not on file  . Intimate partner violence:    Fear of current or ex partner: Not on file    Emotionally abused: Not on file    Physically abused: Not on file    Forced sexual activity: Not on file  Other Topics Concern  . Not on file  Social History Narrative  . Not on file     Physical Exam  Vital Signs and Nursing Notes reviewed Vitals:   01/28/18 1819  BP: 119/87  Pulse: 80  Resp: 16  Temp: 97.9 F (36.6 C)  SpO2: 100%    CONSTITUTIONAL: Well-appearing, NAD NEURO:  Alert and oriented x 3, no focal deficits EYES:  eyes equal and reactive ENT/NECK:  no LAD, no JVD CARDIO: Regular rate, well-perfused, normal S1 and S2 PULM:  CTAB no wheezing or rhonchi GI/GU:  normal bowel sounds, non-distended, non-tender MSK/SPINE:  No gross deformities, no edema SKIN: Mild erythema to the webspace of the right hand between the thumb and index finger, no tenderness, normal range of motion of the entire hand; circular erythematous patch to the right flank, nontender PSYCH:  Appropriate speech and behavior  Diagnostic and Interventional Summary    EKG Interpretation  Date/Time:    Ventricular Rate:    PR Interval:    QRS Duration:   QT Interval:    QTC Calculation:   R Axis:     Text Interpretation:        Labs Reviewed - No data to display  No orders to display    Medications - No data to display   Procedures Critical Care  ED Course and Medical Decision Making  I have reviewed the triage vital signs and the nursing notes.  Pertinent labs & imaging results that  were available during my care of the patient were reviewed by me and considered in my medical decision making (see below for details).  Favoring insect bites with inflammatory/allergic reaction in this 22 year old male.  No tenderness, no induration, nothing to suggest cellulitis or tenosynovitis.  Prescription for Benadryl, reassurance.  After the discussed management above, the patient was determined to be safe for discharge.  The patient was in agreement with this plan and all questions regarding their care were answered.  ED return precautions were discussed and the patient will return to the ED with any significant worsening of condition.  Elmer Sow. Pilar Plate, MD [facility] [email or web address]  Final Clinical Impressions(s) / ED Diagnoses     ICD-10-CM   1. Insect bite of right hand, initial encounter S60.561A    [email or web address]     ED Discharge Orders         Ordered    diphenhydrAMINE (BENADRYL) 25 MG tablet  Every 6 hours     01/28/18 1831             Sabas Sous, MD 01/28/18 1842

## 2018-01-28 NOTE — ED Triage Notes (Signed)
Pt endorses being bit by a bug x 4 days ago to right hand. Slight swelling and redness between thumb and index finger. Able to make fist. VSS

## 2018-01-28 NOTE — Discharge Instructions (Addendum)
You were evaluated in the Emergency Department and after careful evaluation, we did not find any emergent condition requiring admission or further testing in the hospital.  Your symptoms today seem to be due to insect bites.  The burning sensation should improve with time, you can try the Benadryl tablets as needed for burning sensation.  Please return to the Emergency Department if you experience any worsening of your condition.  We encourage you to follow up with a primary care provider.  Thank you for allowing [place] to be a part of your care.

## 2018-05-15 ENCOUNTER — Encounter (HOSPITAL_BASED_OUTPATIENT_CLINIC_OR_DEPARTMENT_OTHER): Payer: Self-pay

## 2018-05-15 ENCOUNTER — Emergency Department (HOSPITAL_BASED_OUTPATIENT_CLINIC_OR_DEPARTMENT_OTHER)
Admission: EM | Admit: 2018-05-15 | Discharge: 2018-05-15 | Disposition: A | Discharge status: Home / Self Care | Payer: Self-pay | Attending: Emergency Medicine | Admitting: Emergency Medicine

## 2018-05-15 ENCOUNTER — Other Ambulatory Visit: Payer: Self-pay

## 2018-05-15 DIAGNOSIS — Z79899 Other long term (current) drug therapy: Secondary | ICD-10-CM | POA: Insufficient documentation

## 2018-05-15 DIAGNOSIS — J4541 Moderate persistent asthma with (acute) exacerbation: Secondary | ICD-10-CM | POA: Insufficient documentation

## 2018-05-15 MED ORDER — ALBUTEROL SULFATE (2.5 MG/3ML) 0.083% IN NEBU: 2.5000 mg | INHALATION_SOLUTION | Freq: Four times a day (QID) | RESPIRATORY_TRACT | 2 refills | Status: DC | PRN

## 2018-05-15 MED ORDER — ALBUTEROL SULFATE (2.5 MG/3ML) 0.083% IN NEBU
INHALATION_SOLUTION | RESPIRATORY_TRACT | Status: AC
  Administered 2018-05-15: 2.5 mg via RESPIRATORY_TRACT
  Filled 2018-05-15: qty 3

## 2018-05-15 MED ORDER — ALBUTEROL SULFATE HFA 108 (90 BASE) MCG/ACT IN AERS
2.0000 | INHALATION_SPRAY | Freq: Once | RESPIRATORY_TRACT | Status: AC
  Administered 2018-05-15: 2 via RESPIRATORY_TRACT
  Filled 2018-05-15: qty 6.7

## 2018-05-15 MED ORDER — BECLOMETHASONE DIPROP HFA 80 MCG/ACT IN AERB: 1.0000 | INHALATION_SPRAY | Freq: Two times a day (BID) | RESPIRATORY_TRACT | 0 refills | Status: AC

## 2018-05-15 MED ORDER — PREDNISONE 50 MG PO TABS
60.0000 mg | ORAL_TABLET | Freq: Once | ORAL | Status: AC
  Administered 2018-05-15: 19:00:00 60 mg via ORAL
  Filled 2018-05-15: qty 1

## 2018-05-15 MED ORDER — IPRATROPIUM-ALBUTEROL 0.5-2.5 (3) MG/3ML IN SOLN
RESPIRATORY_TRACT | Status: AC
  Administered 2018-05-15: 3 mL via RESPIRATORY_TRACT
  Filled 2018-05-15: qty 3

## 2018-05-15 MED ORDER — PREDNISONE 10 MG PO TABS: 40.0000 mg | ORAL_TABLET | Freq: Every day | ORAL | 0 refills | Status: AC

## 2018-05-15 MED ORDER — ALBUTEROL SULFATE (2.5 MG/3ML) 0.083% IN NEBU
2.5000 mg | INHALATION_SOLUTION | Freq: Once | RESPIRATORY_TRACT | Status: AC
  Administered 2018-05-15: 2.5 mg via RESPIRATORY_TRACT

## 2018-05-15 MED ORDER — IPRATROPIUM-ALBUTEROL 0.5-2.5 (3) MG/3ML IN SOLN
3.0000 mL | Freq: Once | RESPIRATORY_TRACT | Status: AC
  Administered 2018-05-15: 3 mL via RESPIRATORY_TRACT

## 2018-05-15 NOTE — ED Provider Notes (Signed)
[facility] EMERGENCY DEPARTMENT Provider Note   CSN: [id number] Arrival date & time: 05/15/18  1654     History   Chief Complaint Chief Complaint  Patient presents with  . Wheezing    HPI Stephen Andrews is a 23 y.o. male.  HPI   Last night started wheezing, inhaler has been empty for 2-3 weeks, took last neb last night and now doesn't have left Wheezing worsened while at work Mild cough, mucous a few days ago Sniffling, runny nose No fever No chest pain  No cigarettes  Past Medical History:  Diagnosis Date  . Asthma   . Back pain   . Testicular torsion     There are no active problems to display for this patient.   Past Surgical History:  Procedure Laterality Date  . SURGERY SCROTAL / TESTICULAR     testicular torsion surgery         Home Medications    Prior to Admission medications   Medication Sig Start Date End Date Taking? Authorizing Provider  albuterol (PROVENTIL HFA;VENTOLIN HFA) 108 (90 Base) MCG/ACT inhaler Inhale 2 puffs into the lungs every 4 (four) hours as needed for wheezing or shortness of breath. 11/26/17   Horton, Mayer Masker, MD  albuterol (PROVENTIL) (2.5 MG/3ML) 0.083% nebulizer solution Take 3 mLs (2.5 mg total) by nebulization every 6 (six) hours as needed for wheezing or shortness of breath. 05/15/18   Alvira Monday, MD  beclomethasone (QVAR REDIHALER) 80 MCG/ACT inhaler Inhale 1 puff into the lungs 2 (two) times daily. 05/15/18   Alvira Monday, MD  cetirizine (ZYRTEC ALLERGY) 10 MG tablet Take 1 tablet (10 mg total) by mouth daily. 08/03/17   Marquette Saa, MD  diphenhydrAMINE (BENADRYL) 25 MG tablet Take 1 tablet (25 mg total) by mouth every 6 (six) hours. 01/28/18   Sabas Sous, MD  fluticasone (FLONASE) 50 MCG/ACT nasal spray Place 2 sprays into both nostrils daily. 11/26/17   Horton, Mayer Masker, MD  predniSONE (DELTASONE) 10 MG tablet Take 4 tablets (40 mg total) by mouth daily for 4 days. 05/15/18 05/19/18   Alvira Monday, MD    Family History No family history on file.  Social History Social History   Tobacco Use  . Smoking status: Never Smoker  . Smokeless tobacco: Never Used  Substance Use Topics  . Alcohol use: Yes    Comment: socially   . Drug use: Not Currently     Allergies   Patient has no known allergies.   Review of Systems Review of Systems  Constitutional: Negative for fever.  HENT: Positive for rhinorrhea. Negative for sore throat.   Respiratory: Positive for cough, shortness of breath and wheezing.   Cardiovascular: Negative for chest pain and leg swelling.  Gastrointestinal: Negative for abdominal pain, nausea and vomiting.  Genitourinary: Negative for difficulty urinating.  Musculoskeletal: Negative for neck stiffness.  Skin: Negative for rash.  Neurological: Negative for syncope and headaches.     Physical Exam Updated Vital Signs BP 119/85 (BP Location: Left Arm)   Pulse 78   Temp 98.4 F (36.9 C) (Oral)   Resp 20   SpO2 100%   Physical Exam Vitals signs and nursing note reviewed.  Constitutional:      General: He is not in acute distress.    Appearance: He is well-developed. He is not diaphoretic.  HENT:     Head: Normocephalic and atraumatic.  Eyes:     Conjunctiva/sclera: Conjunctivae normal.  Neck:  Musculoskeletal: Normal range of motion.  Cardiovascular:     Rate and Rhythm: Normal rate and regular rhythm.     Heart sounds: Normal heart sounds. No murmur. No friction rub. No gallop.   Pulmonary:     Effort: Pulmonary effort is normal. No respiratory distress.     Breath sounds: Normal breath sounds. No wheezing or rales.  Abdominal:     General: There is no distension.     Palpations: Abdomen is soft.     Tenderness: There is no abdominal tenderness. There is no guarding.  Skin:    General: Skin is warm and dry.  Neurological:     Mental Status: He is alert and oriented to person, place, and time.      ED Treatments  / Results  Labs (all labs ordered are listed, but only abnormal results are displayed) Labs Reviewed - No data to display  EKG None  Radiology No results found.  Procedures Procedures (including critical care time)  Medications Ordered in ED Medications  ipratropium-albuterol (DUONEB) 0.5-2.5 (3) MG/3ML nebulizer solution 3 mL (3 mLs Nebulization Given 05/15/18 1713)  albuterol (PROVENTIL) (2.5 MG/3ML) 0.083% nebulizer solution 2.5 mg (2.5 mg Nebulization Given 05/15/18 1713)  albuterol (PROVENTIL HFA;VENTOLIN HFA) 108 (90 Base) MCG/ACT inhaler 2 puff (2 puffs Inhalation Given 05/15/18 1844)  predniSONE (DELTASONE) tablet 60 mg (60 mg Oral Given 05/15/18 1845)     Initial Impression / Assessment and Plan / ED Course  I have reviewed the triage vital signs and the nursing notes.  Pertinent labs & imaging results that were available during my care of the patient were reviewed by me and considered in my medical decision making (see chart for details).    22yo male with history of asthma presents with concern for shortness of breath. Assessed after treatment with improvement in symptoms, clear breath sounds. No signs of pneumothorax, pneumonia. History consistent with prior asthma exacerbations. PERC negative, doubt PE.   Given prednisone, albuterol inhaler and rx for QVAR, albuterol, prednisone. Patient discharged in stable condition with understanding of reasons to return.  Final Clinical Impressions(s) / ED Diagnoses   Final diagnoses:  Moderate persistent asthma with exacerbation    ED Discharge Orders         Ordered    beclomethasone (QVAR REDIHALER) 80 MCG/ACT inhaler  2 times daily     05/15/18 1853    albuterol (PROVENTIL) (2.5 MG/3ML) 0.083% nebulizer solution  Every 6 hours PRN     05/15/18 1853    predniSONE (DELTASONE) 10 MG tablet  Daily     05/15/18 1853           Alvira Monday, MD 05/16/18 2207

## 2018-05-15 NOTE — ED Triage Notes (Signed)
C.o wheezing started last night-out of inhaler and neb-states his work made him come in to be seen-NAD-steady gait

## 2018-06-26 ENCOUNTER — Encounter (HOSPITAL_BASED_OUTPATIENT_CLINIC_OR_DEPARTMENT_OTHER): Payer: Self-pay

## 2018-06-26 ENCOUNTER — Other Ambulatory Visit: Payer: Self-pay

## 2018-06-26 ENCOUNTER — Emergency Department (HOSPITAL_BASED_OUTPATIENT_CLINIC_OR_DEPARTMENT_OTHER): Payer: Self-pay

## 2018-06-26 ENCOUNTER — Emergency Department (HOSPITAL_BASED_OUTPATIENT_CLINIC_OR_DEPARTMENT_OTHER)
Admission: EM | Admit: 2018-06-26 | Discharge: 2018-06-26 | Disposition: A | Discharge status: Home / Self Care | Payer: Self-pay | Attending: Emergency Medicine | Admitting: Emergency Medicine

## 2018-06-26 DIAGNOSIS — Z79899 Other long term (current) drug therapy: Secondary | ICD-10-CM | POA: Insufficient documentation

## 2018-06-26 DIAGNOSIS — J45909 Unspecified asthma, uncomplicated: Secondary | ICD-10-CM | POA: Insufficient documentation

## 2018-06-26 DIAGNOSIS — R091 Pleurisy: Secondary | ICD-10-CM | POA: Insufficient documentation

## 2018-06-26 LAB — BASIC METABOLIC PANEL
Anion gap: 6 (ref 5–15)
BUN: 18 mg/dL (ref 6–20)
CO2: 25 mmol/L (ref 22–32)
Calcium: 9.3 mg/dL (ref 8.9–10.3)
Chloride: 104 mmol/L (ref 98–111)
Creatinine, Ser: 1.33 mg/dL — ABNORMAL HIGH (ref 0.61–1.24)
GFR calc Af Amer: >60 mL/min (ref >60)
GFR calc non Af Amer: >60 mL/min (ref >60)
Glucose, Bld: 87 mg/dL (ref 70–99)
Potassium: 3.7 mmol/L (ref 3.5–5.1)
Sodium: 135 mmol/L (ref 135–145)

## 2018-06-26 LAB — CBC
HCT: 51.3 % (ref 39.0–52.0)
Hemoglobin: 17.2 g/dL — ABNORMAL HIGH (ref 13.0–17.0)
MCH: 30 pg (ref 26.0–34.0)
MCHC: 33.5 g/dL (ref 30.0–36.0)
MCV: 89.5 fL (ref 80.0–100.0)
Platelets: 230 10*3/uL (ref 150–400)
RBC: 5.73 MIL/uL (ref 4.22–5.81)
RDW: 12.4 % (ref 11.5–15.5)
WBC: 4.6 10*3/uL (ref 4.0–10.5)
nRBC: 0 % (ref 0.0–0.2)

## 2018-06-26 LAB — D-DIMER, QUANTITATIVE: D-Dimer, Quant: <0.27 ug/mL-FEU (ref 0.00–0.50)

## 2018-06-26 MED ORDER — KETOROLAC TROMETHAMINE 30 MG/ML IJ SOLN
30.0000 mg | Freq: Once | INTRAMUSCULAR | Status: AC
  Administered 2018-06-26: 30 mg via INTRAVENOUS
  Filled 2018-06-26: qty 1

## 2018-06-26 MED ORDER — SODIUM CHLORIDE 0.9% FLUSH
3.0000 mL | Freq: Once | INTRAVENOUS | Status: DC
  Filled 2018-06-26: qty 3

## 2018-06-26 MED ORDER — NAPROXEN 500 MG PO TABS: 500.0000 mg | ORAL_TABLET | Freq: Two times a day (BID) | ORAL | 0 refills | Status: DC

## 2018-06-26 NOTE — ED Provider Notes (Signed)
[facility] EMERGENCY DEPARTMENT Provider Note   CSN: [id number] Arrival date & time: 06/26/18  1155    History   Chief Complaint Chief Complaint  Patient presents with  . Chest Pain    HPI Lyndel Kribs is a 23 y.o. male.     Patient is a 23 year old male who presents with left-sided chest pain.  He says it is a sharp pain that is been going on about 3 days.  It is worse with movement and deep breathing.  He denies any shortness of breath other than he does have asthma and feels like his asthma may have been a little worse over the last few days.  No leg pain or swelling.  No history of blood clots.  No recent immobilization.  No fevers.  No cough although he has had some minor URI symptoms.  No history of similar pain in the past.  No recent injuries.     Past Medical History:  Diagnosis Date  . Asthma   . Back pain   . Testicular torsion     There are no active problems to display for this patient.   Past Surgical History:  Procedure Laterality Date  . SURGERY SCROTAL / TESTICULAR     testicular torsion surgery         Home Medications    Prior to Admission medications   Medication Sig Start Date End Date Taking? Authorizing Provider  albuterol (PROVENTIL HFA;VENTOLIN HFA) 108 (90 Base) MCG/ACT inhaler Inhale 2 puffs into the lungs every 4 (four) hours as needed for wheezing or shortness of breath. 11/26/17   Horton, Mayer Masker, MD  albuterol (PROVENTIL) (2.5 MG/3ML) 0.083% nebulizer solution Take 3 mLs (2.5 mg total) by nebulization every 6 (six) hours as needed for wheezing or shortness of breath. 05/15/18   Alvira Monday, MD  beclomethasone (QVAR REDIHALER) 80 MCG/ACT inhaler Inhale 1 puff into the lungs 2 (two) times daily. 05/15/18   Alvira Monday, MD  cetirizine (ZYRTEC ALLERGY) 10 MG tablet Take 1 tablet (10 mg total) by mouth daily. 08/03/17   Marquette Saa, MD  diphenhydrAMINE (BENADRYL) 25 MG tablet Take 1 tablet (25 mg total) by  mouth every 6 (six) hours. 01/28/18   Sabas Sous, MD  fluticasone (FLONASE) 50 MCG/ACT nasal spray Place 2 sprays into both nostrils daily. 11/26/17   Horton, Mayer Masker, MD  naproxen (NAPROSYN) 500 MG tablet Take 1 tablet (500 mg total) by mouth 2 (two) times daily. 06/26/18   Rolan Bucco, MD    Family History No family history on file.  Social History Social History   Tobacco Use  . Smoking status: Never Smoker  . Smokeless tobacco: Never Used  Substance Use Topics  . Alcohol use: Yes    Comment: occ  . Drug use: Yes    Types: Marijuana     Allergies   Patient has no known allergies.   Review of Systems Review of Systems  Constitutional: Negative for chills, diaphoresis, fatigue and fever.  HENT: Negative for congestion, rhinorrhea and sneezing.   Eyes: Negative.   Respiratory: Positive for shortness of breath. Negative for cough and chest tightness.   Cardiovascular: Positive for chest pain. Negative for leg swelling.  Gastrointestinal: Negative for abdominal pain, blood in stool, diarrhea, nausea and vomiting.  Genitourinary: Negative for difficulty urinating, flank pain, frequency and hematuria.  Musculoskeletal: Negative for arthralgias and back pain.  Skin: Negative for rash.  Neurological: Negative for dizziness, speech difficulty, weakness, numbness and  headaches.     Physical Exam Updated Vital Signs BP 138/88   Pulse 62   Temp 98.2 F (36.8 C) (Oral)   Resp 17   Ht 5\' 8"  (1.727 m)   Wt 86.2 kg   SpO2 97%   BMI 28.89 kg/m   Physical Exam Constitutional:      Appearance: He is well-developed.  HENT:     Head: Normocephalic and atraumatic.  Eyes:     Pupils: Pupils are equal, round, and reactive to light.  Neck:     Musculoskeletal: Normal range of motion and neck supple.  Cardiovascular:     Rate and Rhythm: Normal rate and regular rhythm.     Heart sounds: Normal heart sounds.  Pulmonary:     Effort: Pulmonary effort is normal. No  respiratory distress.     Breath sounds: Normal breath sounds. No wheezing or rales.  Chest:     Chest wall: No tenderness.  Abdominal:     General: Bowel sounds are normal.     Palpations: Abdomen is soft.     Tenderness: There is no abdominal tenderness. There is no guarding or rebound.  Musculoskeletal: Normal range of motion.     Comments: No edema or calf tenderness  Lymphadenopathy:     Cervical: No cervical adenopathy.  Skin:    General: Skin is warm and dry.     Findings: No rash.  Neurological:     Mental Status: He is alert and oriented to person, place, and time.      ED Treatments / Results  Labs (all labs ordered are listed, but only abnormal results are displayed) Labs Reviewed  BASIC METABOLIC PANEL - Abnormal; Notable for the following components:      Result Value   Creatinine, Ser 1.33 (*)    All other components within normal limits  CBC - Abnormal; Notable for the following components:   Hemoglobin 17.2 (*)    All other components within normal limits  D-DIMER, QUANTITATIVE (NOT AT [facility])    EKG EKG Interpretation  Date/Time:  Wednesday June 26 2018 12:02:21 EST Ventricular Rate:  71 PR Interval:  114 QRS Duration: 94 QT Interval:  366 QTC Calculation: 397 R Axis:   79 Text Interpretation:  Normal sinus rhythm Nonspecific T wave abnormality Abnormal ECG since last tracing no significant change Confirmed by Rolan Bucco [phone]) on 06/26/2018 1:15:41 PM Also confirmed by Rolan Bucco [phone]), editor Barbette Hair [phone])  on 06/26/2018 1:30:20 PM   Radiology Dg Chest 2 View  Result Date: 06/26/2018 CLINICAL DATA:  Left-sided chest pain for several days, no known injury, initial encounter EXAM: CHEST - 2 VIEW COMPARISON:  09/30/2017 FINDINGS: The heart size and mediastinal contours are within normal limits. Both lungs are clear. The visualized skeletal structures are unremarkable. IMPRESSION: No active cardiopulmonary disease. Electronically Signed    By: Alcide Clever M.D.   On: 06/26/2018 12:28    Procedures Procedures (including critical care time)  Medications Ordered in ED Medications  sodium chloride flush (NS) 0.9 % injection 3 mL (3 mLs Intravenous Not Given 06/26/18 1400)  ketorolac (TORADOL) 30 MG/ML injection 30 mg (30 mg Intravenous Given 06/26/18 1322)     Initial Impression / Assessment and Plan / ED Course  I have reviewed the triage vital signs and the nursing notes.  Pertinent labs & imaging results that were available during my care of the patient were reviewed by me and considered in my medical decision making (see chart for  details).      Patient is a 23 year old male who presents with pleuritic type chest pain.  His EKG does not show any acute abnormalities.  He does not have symptoms more concerning for ACS.  His d-dimer is normal and he has no other suggestions of pulmonary embolus.  No hypoxia.  His lungs are clear on exam.  I feel this symptoms are consistent with pleurisy.  I initially have prescribed him Naprosyn for his pain although I realize that his creatinine was elevated.  It is been elevated the last couple times he has had it checked but given this, I did call the pharmacy and canceled his Naprosyn prescription and advised him to let him know just to take Tylenol.  He was encouraged to establish care with a primary care physician.  I did advise him that his creatinine is elevated and he needs to have this followed up by primary care physician.  Return precautions were given.   Final Clinical Impressions(s) / ED Diagnoses   Final diagnoses:  Pleurisy    ED Discharge Orders         Ordered    naproxen (NAPROSYN) 500 MG tablet  2 times daily     06/26/18 1505           Rolan BuccoBelfi, Darnelle Derrick, MD 06/26/18 1523

## 2018-06-26 NOTE — ED Triage Notes (Addendum)
C/o CP x 3 days-pain worse with movement and deep breaths-NAD-steady gait

## 2018-07-09 ENCOUNTER — Encounter (HOSPITAL_BASED_OUTPATIENT_CLINIC_OR_DEPARTMENT_OTHER): Payer: Self-pay | Admitting: Emergency Medicine

## 2018-07-09 ENCOUNTER — Emergency Department (HOSPITAL_BASED_OUTPATIENT_CLINIC_OR_DEPARTMENT_OTHER)
Admission: EM | Admit: 2018-07-09 | Discharge: 2018-07-09 | Disposition: A | Discharge status: Home / Self Care | Payer: Self-pay | Attending: Emergency Medicine | Admitting: Emergency Medicine

## 2018-07-09 ENCOUNTER — Other Ambulatory Visit: Payer: Self-pay

## 2018-07-09 DIAGNOSIS — J45901 Unspecified asthma with (acute) exacerbation: Secondary | ICD-10-CM | POA: Insufficient documentation

## 2018-07-09 MED ORDER — PREDNISONE 20 MG PO TABS: 40.0000 mg | ORAL_TABLET | Freq: Every day | ORAL | 0 refills | Status: AC

## 2018-07-09 MED ORDER — ALBUTEROL SULFATE HFA 108 (90 BASE) MCG/ACT IN AERS
INHALATION_SPRAY | RESPIRATORY_TRACT | Status: AC
  Administered 2018-07-09: 2 via RESPIRATORY_TRACT
  Filled 2018-07-09: qty 6.7

## 2018-07-09 MED ORDER — ALBUTEROL SULFATE HFA 108 (90 BASE) MCG/ACT IN AERS: 1.0000 | INHALATION_SPRAY | Freq: Four times a day (QID) | RESPIRATORY_TRACT | 0 refills | Status: DC | PRN

## 2018-07-09 MED ORDER — PREDNISONE 50 MG PO TABS
60.0000 mg | ORAL_TABLET | Freq: Once | ORAL | Status: AC
  Administered 2018-07-09: 60 mg via ORAL
  Filled 2018-07-09: qty 1

## 2018-07-09 MED ORDER — ALBUTEROL SULFATE (2.5 MG/3ML) 0.083% IN NEBU
INHALATION_SOLUTION | RESPIRATORY_TRACT | Status: AC
  Administered 2018-07-09: 2.5 mg
  Filled 2018-07-09: qty 3

## 2018-07-09 MED ORDER — ALBUTEROL SULFATE HFA 108 (90 BASE) MCG/ACT IN AERS
2.0000 | INHALATION_SPRAY | Freq: Once | RESPIRATORY_TRACT | Status: AC
  Administered 2018-07-09: 2 via RESPIRATORY_TRACT

## 2018-07-09 MED ORDER — IPRATROPIUM-ALBUTEROL 0.5-2.5 (3) MG/3ML IN SOLN: 3.0000 mL | Freq: Four times a day (QID) | RESPIRATORY_TRACT | Status: DC

## 2018-07-09 MED ORDER — IPRATROPIUM-ALBUTEROL 0.5-2.5 (3) MG/3ML IN SOLN
RESPIRATORY_TRACT | Status: AC
  Administered 2018-07-09: 3 mL
  Filled 2018-07-09: qty 3

## 2018-07-09 NOTE — Discharge Instructions (Signed)
We believe that your symptoms are caused today by an exacerbation of your asthma.  Please take the prescribed medications and any medications that you have at home.  Follow up with your doctor as recommended.  If you develop any new or worsening symptoms, including but not limited to fever, persistent vomiting, worsening shortness of breath, or other symptoms that concern you, please return to the Emergency Department immediately. ° ° °Asthma °Asthma is a recurring condition in which the airways tighten and narrow. Asthma can make it difficult to breathe. It can cause coughing, wheezing, and shortness of breath. Asthma episodes, also called asthma attacks, range from minor to life-threatening. Asthma cannot be cured, but medicines and lifestyle changes can help control it. °CAUSES °Asthma is believed to be caused by inherited (genetic) and environmental factors, but its exact cause is unknown. Asthma may be triggered by allergens, lung infections, or irritants in the air. Asthma triggers are different for each person. Common triggers include:  °Animal dander. °Dust mites. °Cockroaches. °Pollen from trees or grass. °Mold. °Smoke. °Air pollutants such as dust, household cleaners, hair sprays, aerosol sprays, paint fumes, strong chemicals, or strong odors. °Cold air, weather changes, and winds (which increase molds and pollens in the air). °Strong emotional expressions such as crying or laughing hard. °Stress. °Certain medicines (such as aspirin) or types of drugs (such as beta-blockers). °Sulfites in foods and drinks. Foods and drinks that may contain sulfites include dried fruit, potato chips, and sparkling grape juice. °Infections or inflammatory conditions such as the flu, a cold, or an inflammation of the nasal membranes (rhinitis). °Gastroesophageal reflux disease (GERD). °Exercise or strenuous activity. °SYMPTOMS °Symptoms may occur immediately after asthma is triggered or many hours later. Symptoms  include: °Wheezing. °Excessive nighttime or early morning coughing. °Frequent or severe coughing with a common cold. °Chest tightness. °Shortness of breath. °DIAGNOSIS  °The diagnosis of asthma is made by a review of your medical history and a physical exam. Tests may also be performed. These may include: °Lung function studies. These tests show how much air you breathe in and out. °Allergy tests. °Imaging tests such as X-rays. °TREATMENT  °Asthma cannot be cured, but it can usually be controlled. Treatment involves identifying and avoiding your asthma triggers. It also involves medicines. There are 2 classes of medicine used for asthma treatment:  °Controller medicines. These prevent asthma symptoms from occurring. They are usually taken every day. °Reliever or rescue medicines. These quickly relieve asthma symptoms. They are used as needed and provide short-term relief. °Your health care provider will help you create an asthma action plan. An asthma action plan is a written plan for managing and treating your asthma attacks. It includes a list of your asthma triggers and how they may be avoided. It also includes information on when medicines should be taken and when their dosage should be changed. An action plan may also involve the use of a device called a peak flow meter. A peak flow meter measures how well the lungs are working. It helps you monitor your condition. °HOME CARE INSTRUCTIONS  °Take medicines only as directed by your health care provider. Speak with your health care provider if you have questions about how or when to take the medicines. °Use a peak flow meter as directed by your health care provider. Record and keep track of readings. °Understand and use the action plan to help minimize or stop an asthma attack without needing to seek medical care. °Control your home environment in the following   ways to help prevent asthma attacks: °Do not smoke. Avoid being exposed to secondhand smoke. °Change  your heating and air conditioning filter regularly. °Limit your use of fireplaces and wood stoves. °Get rid of pests (such as roaches and mice) and their droppings. °Throw away plants if you see mold on them. °Clean your floors and dust regularly. Use unscented cleaning products. °Try to have someone else vacuum for you regularly. Stay out of rooms while they are being vacuumed and for a short while afterward. If you vacuum, use a dust mask from a hardware store, a double-layered or microfilter vacuum cleaner bag, or a vacuum cleaner with a HEPA filter. °Replace carpet with wood, tile, or vinyl flooring. Carpet can trap dander and dust. °Use allergy-proof pillows, mattress covers, and box spring covers. °Wash bed sheets and blankets every week in hot water and dry them in a dryer. °Use blankets that are made of polyester or cotton. °Clean bathrooms and kitchens with bleach. If possible, have someone repaint the walls in these rooms with mold-resistant paint. Keep out of the rooms that are being cleaned and painted. °Wash hands frequently. °SEEK MEDICAL CARE IF:  °You have wheezing, shortness of breath, or a cough even if taking medicine to prevent attacks. °The colored mucus you cough up (sputum) is thicker than usual. °Your sputum changes from clear or white to yellow, green, gray, or bloody. °You have any problems that may be related to the medicines you are taking (such as a rash, itching, swelling, or trouble breathing). °You are using a reliever medicine more than 2-3 times per week. °Your peak flow is still at 50-79% of your personal best after following your action plan for 1 hour. °You have a fever. °SEEK IMMEDIATE MEDICAL CARE IF:  °You seem to be getting worse and are unresponsive to treatment during an asthma attack. °You are short of breath even at rest. °You get short of breath when doing very little physical activity. °You have difficulty eating, drinking, or talking due to asthma symptoms. °You  develop chest pain. °You develop a fast heartbeat. °You have a bluish color to your lips or fingernails. °You are light-headed, dizzy, or faint. °Your peak flow is less than 50% of your personal best. °MAKE SURE YOU:  °Understand these instructions. °Will watch your condition. °Will get help right away if you are not doing well or get worse. °Document Released: 04/10/2005 Document Revised: 08/25/2013 Document Reviewed: 11/07/2012 °[employer]® Patient Information ©2015 [employer]. This information is not intended to replace advice given to you by your health care provider. Make sure you discuss any questions you have with your health care provider. ° °How to Use a Nebulizer °If you have asthma or other breathing problems, you might need to breathe in (inhale) medicine. This can be done with a nebulizer. A nebulizer is a device that turns liquid medicine into a mist that you can inhale.  °There are different kinds of nebulizers. Most are small. With some, you breathe in through a mouthpiece. With others, a mask fits over your nose and mouth. Most nebulizers must be connected to a small air compressor. Air is forced through tubing from the compressor to the nebulizer. The forced air changes the liquid into a fine spray. °RISKS AND COMPLICATIONS °The nebulizer must work properly for it to help your breathing. If the nebulizer does not produce mist, or if foam comes out, this indicates that the nebulizer is not working properly. Sometimes a filter can get clogged, or   there might be a problem with the air compressor. Check the instruction booklet that came with your nebulizer. It should tell you how to fix problems or where to call for help. You should have at least one extra nebulizer at home. That way, you will always have one when you need it.  °HOW TO PREPARE BEFORE USING THE NEBULIZER °Take these steps before using the nebulizer: °Check your medicine. Make sure it has not expired and is not damaged in any way.    °Wash your hands with soap and water.   °Put all the parts of your nebulizer on a sturdy, flat surface. Make sure the tubing connects the compressor and the nebulizer. °Measure the liquid medicine according to your health care provider's instructions. Pour it into the nebulizer. °Attach the mouthpiece or mask.   °Test the nebulizer by turning it on to make sure a spray is coming out. Then, turn it off.   °HOW TO USE THE NEBULIZER °Sit down and focus on staying relaxed.   °If your nebulizer has a mask, put it over your nose and mouth. If you use a mouthpiece, put it in your mouth. Press your lips firmly around the mouthpiece. °Turn on the nebulizer.   °Breathe out.   °Some nebulizers have a finger valve. If yours does, cover up the air hole so the air gets to the nebulizer. °Once the medicine begins to mist out, take slow, deep breaths. If there is a finger valve, release it at the end of your breath. °Continue taking slow, deep breaths until the nebulizer is empty.   °Be sure to stop the machine at any point if you start coughing or if the medicine foams or bubbles. °HOW TO CLEAN THE NEBULIZER  °The nebulizer and all its parts must be kept very clean. Follow the manufacturer's instructions for cleaning. For most nebulizers, you should follow these guidelines: °Wash the nebulizer after each use. Use warm water and soap. Rinse it well. Shake the nebulizer to remove extra water. Put it on a clean towel until it is completely dry. To make sure it is dry, put the nebulizer back together. Turn on the compressor for a few minutes. This will blow air through the nebulizer.   °Do not wash the tubing or the finger valve.   °Store the nebulizer in a dust-free place.   °Inspect the filter every week. Replace it any time it looks dirty.   °Sometimes the nebulizer will need a more complete cleaning. The instruction booklet should say how often you need to do this. °SEEK MEDICAL CARE IF:  °You continue to have difficulty  breathing.   °You have trouble using the nebulizer.   °Document Released: 03/29/2009 Document Revised: 08/25/2013 Document Reviewed: 09/30/2012 °[employer]® Patient Information ©2015 [employer]. This information is not intended to replace advice given to you by your health care provider. Make sure you discuss any questions you have with your health care provider. ° °How to Use an Inhaler °Proper inhaler technique is very important. Good technique ensures that the medicine reaches the lungs. Poor technique results in depositing the medicine on the tongue and back of the throat rather than in the airways. If you do not use the inhaler with good technique, the medicine will not help you. °STEPS TO FOLLOW IF USING AN INHALER WITHOUT AN EXTENSION TUBE °Remove the cap from the inhaler. °If you are using the inhaler for the first time, you will need to prime it. Shake the inhaler for   5 seconds and release four puffs into the air, away from your face. Ask your health care provider or pharmacist if you have questions about priming your inhaler. °Shake the inhaler for 5 seconds before each breath in (inhalation). °Position the inhaler so that the top of the canister faces up. °Put your index finger on the top of the medicine canister. Your thumb supports the bottom of the inhaler. °Open your mouth. °Either place the inhaler between your teeth and place your lips tightly around the mouthpiece, or hold the inhaler 1-2 inches away from your open mouth. If you are unsure of which technique to use, ask your health care provider. °Breathe out (exhale) normally and as completely as possible. °Press the canister down with your index finger to release the medicine. °At the same time as the canister is pressed, inhale deeply and slowly until your lungs are completely filled. This should take 4-6 seconds. Keep your tongue down. °Hold the medicine in your lungs for 5-10 seconds (10 seconds is best). This helps the medicine get into the  small airways of your lungs. °Breathe out slowly, through pursed lips. Whistling is an example of pursed lips. °Wait at least 15-30 seconds between puffs. Continue with the above steps until you have taken the number of puffs your health care provider has ordered. Do not use the inhaler more than your health care provider tells you. °Replace the cap on the inhaler. °Follow the directions from your health care provider or the inhaler insert for cleaning the inhaler. °STEPS TO FOLLOW IF USING AN INHALER WITH AN EXTENSION (SPACER) °Remove the cap from the inhaler. °If you are using the inhaler for the first time, you will need to prime it. Shake the inhaler for 5 seconds and release four puffs into the air, away from your face. Ask your health care provider or pharmacist if you have questions about priming your inhaler. °Shake the inhaler for 5 seconds before each breath in (inhalation). °Place the open end of the spacer onto the mouthpiece of the inhaler. °Position the inhaler so that the top of the canister faces up and the spacer mouthpiece faces you. °Put your index finger on the top of the medicine canister. Your thumb supports the bottom of the inhaler and the spacer. °Breathe out (exhale) normally and as completely as possible. °Immediately after exhaling, place the spacer between your teeth and into your mouth. Close your lips tightly around the spacer. °Press the canister down with your index finger to release the medicine. °At the same time as the canister is pressed, inhale deeply and slowly until your lungs are completely filled. This should take 4-6 seconds. Keep your tongue down and out of the way. °Hold the medicine in your lungs for 5-10 seconds (10 seconds is best). This helps the medicine get into the small airways of your lungs. Exhale. °Repeat inhaling deeply through the spacer mouthpiece. Again hold that breath for up to 10 seconds (10 seconds is best). Exhale slowly. If it is difficult to take  this second deep breath through the spacer, breathe normally several times through the spacer. Remove the spacer from your mouth. °Wait at least 15-30 seconds between puffs. Continue with the above steps until you have taken the number of puffs your health care provider has ordered. Do not use the inhaler more than your health care provider tells you. °Remove the spacer from the inhaler, and place the cap on the inhaler. °Follow the directions from your health care provider   or the inhaler insert for cleaning the inhaler and spacer. °If you are using different kinds of inhalers, use your quick relief medicine to open the airways 10-15 minutes before using a steroid if instructed to do so by your health care provider. If you are unsure which inhalers to use and the order of using them, ask your health care provider, nurse, or respiratory therapist. °If you are using a steroid inhaler, always rinse your mouth with water after your last puff, then gargle and spit out the water. Do not swallow the water. °AVOID: °Inhaling before or after starting the spray of medicine. It takes practice to coordinate your breathing with triggering the spray. °Inhaling through the nose (rather than the mouth) when triggering the spray. °HOW TO DETERMINE IF YOUR INHALER IS FULL OR NEARLY EMPTY °You cannot know when an inhaler is empty by shaking it. A few inhalers are now being made with dose counters. Ask your health care provider for a prescription that has a dose counter if you feel you need that extra help. If your inhaler does not have a counter, ask your health care provider to help you determine the date you need to refill your inhaler. Write the refill date on a calendar or your inhaler canister. Refill your inhaler 7-10 days before it runs out. Be sure to keep an adequate supply of medicine. This includes making sure it is not expired, and that you have a spare inhaler.  °SEEK MEDICAL CARE IF:  °Your symptoms are only partially  relieved with your inhaler. °You are having trouble using your inhaler. °You have some increase in phlegm. °SEEK IMMEDIATE MEDICAL CARE IF:  °You feel little or no relief with your inhalers. You are still wheezing and are feeling shortness of breath or tightness in your chest or both. °You have dizziness, headaches, or a fast heart rate. °You have chills, fever, or night sweats. °You have a noticeable increase in phlegm production, or there is blood in the phlegm. °MAKE SURE YOU:  °Understand these instructions. °Will watch your condition. °Will get help right away if you are not doing well or get worse. °Document Released: 04/07/2000 Document Revised: 01/29/2013 Document Reviewed: 11/07/2012 °[employer]® Patient Information ©2015 [employer]. This information is not intended to replace advice given to you by your health care provider. Make sure you discuss any questions you have with your health care provider. ° ° ° °

## 2018-07-09 NOTE — ED Triage Notes (Signed)
Reports shortness of breath x 2 hours with history of asthma.  Denies treatment PTA.  Ambulatory to triage in NAD, speaking in full sentences without difficulty.

## 2018-07-09 NOTE — ED Provider Notes (Signed)
Emergency Department Provider Note   I have reviewed the triage vital signs and the nursing notes.   HISTORY  Chief Complaint Asthma   HPI Stephen Andrews is a 23 y.o. male with PMH of asthma presents to the emergency department for evaluation of wheezing and shortness of breath.  Symptoms began 2 hours prior while the patient was at work.  Denies any chest pain.  No fevers or chills.  No travel history.  No known sick contact.  He has run out of his albuterol inhaler and did not have it with him.  He states this feels like prior asthma exacerbations for him.  No radiation of symptoms or other modifying factors.  Past Medical History:  Diagnosis Date  . Asthma   . Back pain   . Testicular torsion     There are no active problems to display for this patient.   Past Surgical History:  Procedure Laterality Date  . SURGERY SCROTAL / TESTICULAR     testicular torsion surgery    Allergies Patient has no known allergies.  History reviewed. No pertinent family history.  Social History Social History   Tobacco Use  . Smoking status: Never Smoker  . Smokeless tobacco: Never Used  Substance Use Topics  . Alcohol use: Yes    Comment: occ  . Drug use: Yes    Types: Marijuana    Review of Systems  Constitutional: No fever/chills Eyes: No visual changes. ENT: No sore throat. Cardiovascular: Denies chest pain. Respiratory: Positive SOB and wheezing.  Gastrointestinal: No abdominal pain.  No nausea, no vomiting.  No diarrhea.  No constipation. Genitourinary: Negative for dysuria. Musculoskeletal: Negative for back pain. Skin: Negative for rash. Neurological: Negative for headaches, focal weakness or numbness.  10-point ROS otherwise negative.  ____________________________________________   PHYSICAL EXAM:  VITAL SIGNS: ED Triage Vitals [07/09/18 1845]  Enc Vitals Group     BP 127/79     Pulse Rate 66     Resp 16     Temp 97.9 F (36.6 C)     Temp Source  Oral     SpO2 99 %     Weight 190 lb (86.2 kg)     Height 5\' 8"  (1.727 m)   Constitutional: Alert and oriented. Well appearing and in no acute distress. Eyes: Conjunctivae are normal.  Head: Atraumatic. Nose: No congestion/rhinnorhea. Mouth/Throat: Mucous membranes are moist. Neck: No stridor.  Cardiovascular: Normal rate, regular rhythm. Good peripheral circulation. Grossly normal heart sounds.   Respiratory: Normal respiratory effort.  No retractions. Lungs with bilateral end-expiratory wheezing.  Gastrointestinal: Soft and nontender. No distention.  Musculoskeletal: No lower extremity tenderness nor edema. No gross deformities of extremities. Neurologic:  Normal speech and language. No gross focal neurologic deficits are appreciated.  Skin:  Skin is warm, dry and intact. No rash noted.  ____________________________________________  RADIOLOGY  None  ____________________________________________   PROCEDURES  Procedure(s) performed:   Procedures  None  ____________________________________________   INITIAL IMPRESSION / ASSESSMENT AND PLAN / ED COURSE  Pertinent labs & imaging results that were available during my care of the patient were reviewed by me and considered in my medical decision making (see chart for details).  Patient presents to the emergency department for evaluation of shortness of breath with wheezing.  No chest pain.  Normal oxygen saturation.  Patient with wheezing.  No rales on exam.  No flulike symptoms, fevers, sick contacts, travel.  No indication for chest x-ray with symmetrical exam.  Plan  for nebs, steroid, reassess.   07:12 PM  Patient feeling much better after nebulizer.  Reassessed and wheezing is significantly decreased.  Patient given albuterol inhaler at time of discharge.  He will be given 4 days of additional steroid and refill for his inhaler.  Provided primary care physician follow-up information.  Discussed ED return precautions. No  concern clinically for infectious process.  ____________________________________________  FINAL CLINICAL IMPRESSION(S) / ED DIAGNOSES  Final diagnoses:  Moderate asthma with exacerbation, unspecified whether persistent     MEDICATIONS GIVEN DURING THIS VISIT:  Medications  ipratropium-albuterol (DUONEB) 0.5-2.5 (3) MG/3ML nebulizer solution 3 mL (3 mLs Nebulization Not Given 07/09/18 1905)  predniSONE (DELTASONE) tablet 60 mg (60 mg Oral Given 07/09/18 1905)  albuterol (PROVENTIL) (2.5 MG/3ML) 0.083% nebulizer solution (2.5 mg  Given 07/09/18 1854)  ipratropium-albuterol (DUONEB) 0.5-2.5 (3) MG/3ML nebulizer solution (3 mLs  Given 07/09/18 1854)  albuterol (PROVENTIL HFA;VENTOLIN HFA) 108 (90 Base) MCG/ACT inhaler 2 puff (2 puffs Inhalation Given 07/09/18 1904)     NEW OUTPATIENT MEDICATIONS STARTED DURING THIS VISIT:  New Prescriptions   ALBUTEROL (PROVENTIL HFA;VENTOLIN HFA) 108 (90 BASE) MCG/ACT INHALER    Inhale 1-2 puffs into the lungs every 6 (six) hours as needed for wheezing or shortness of breath.   PREDNISONE (DELTASONE) 20 MG TABLET    Take 2 tablets (40 mg total) by mouth daily for 4 days.    Note:  This document was prepared using Dragon voice recognition software and may include unintentional dictation errors.  Alona Bene, MD Emergency Medicine    Hayk Divis, Arlyss Repress, MD 07/09/18 [phone]

## 2018-08-03 ENCOUNTER — Emergency Department (HOSPITAL_BASED_OUTPATIENT_CLINIC_OR_DEPARTMENT_OTHER)
Admission: EM | Admit: 2018-08-03 | Discharge: 2018-08-03 | Disposition: A | Discharge status: Home / Self Care | Payer: Self-pay | Attending: Emergency Medicine | Admitting: Emergency Medicine

## 2018-08-03 ENCOUNTER — Encounter (HOSPITAL_BASED_OUTPATIENT_CLINIC_OR_DEPARTMENT_OTHER): Payer: Self-pay | Admitting: Emergency Medicine

## 2018-08-03 DIAGNOSIS — Z79899 Other long term (current) drug therapy: Secondary | ICD-10-CM | POA: Insufficient documentation

## 2018-08-03 DIAGNOSIS — J4521 Mild intermittent asthma with (acute) exacerbation: Secondary | ICD-10-CM | POA: Insufficient documentation

## 2018-08-03 MED ORDER — AEROCHAMBER PLUS FLO-VU MEDIUM MISC
1.0000 | Freq: Once | Status: DC
  Filled 2018-08-03: qty 1

## 2018-08-03 MED ORDER — METHYLPREDNISOLONE SODIUM SUCC 125 MG IJ SOLR
80.0000 mg | Freq: Once | INTRAMUSCULAR | Status: AC
  Administered 2018-08-03: 22:00:00 80 mg via INTRAMUSCULAR
  Filled 2018-08-03: qty 2

## 2018-08-03 MED ORDER — PREDNISONE 10 MG PO TABS: 20.0000 mg | ORAL_TABLET | Freq: Every day | ORAL | 0 refills | Status: AC

## 2018-08-03 MED ORDER — ALBUTEROL SULFATE HFA 108 (90 BASE) MCG/ACT IN AERS
8.0000 | INHALATION_SPRAY | Freq: Once | RESPIRATORY_TRACT | Status: AC
  Administered 2018-08-03: 22:00:00 8 via RESPIRATORY_TRACT
  Filled 2018-08-03: qty 6.7

## 2018-08-03 MED ORDER — IPRATROPIUM BROMIDE HFA 17 MCG/ACT IN AERS
2.0000 | INHALATION_SPRAY | Freq: Once | RESPIRATORY_TRACT | Status: AC
  Administered 2018-08-03: 2 via RESPIRATORY_TRACT
  Filled 2018-08-03: qty 12.9

## 2018-08-03 NOTE — ED Provider Notes (Signed)
[facility] EMERGENCY DEPARTMENT Provider Note   CSN: [id number] Arrival date & time: 08/03/18  2108    History   Chief Complaint Chief Complaint  Patient presents with  . Shortness of Breath    HPI Stephen Andrews is a 23 y.o. male with past medical history of asthma, presenting to the emergency department with complaint of worsening shortness of breath that began today.  Patient states his breathing has been worse since his last ED visit the beginning of this [facility].  He states he feels as though the albuterol prescribed has not been providing relief of his shortness of breath throughout this past week.  He has been using it more frequently, and therefore has run out.  He has been treating his symptoms with Zyrtec as well, for his seasonal allergies.  He has a cough, however he only has clear phlegm.  Denies fevers, or other URI symptoms.     The history is provided by the patient.    Past Medical History:  Diagnosis Date  . Asthma   . Back pain   . Testicular torsion     There are no active problems to display for this patient.   Past Surgical History:  Procedure Laterality Date  . SURGERY SCROTAL / TESTICULAR     testicular torsion surgery         Home Medications    Prior to Admission medications   Medication Sig Start Date End Date Taking? Authorizing Provider  albuterol (PROVENTIL HFA;VENTOLIN HFA) 108 (90 Base) MCG/ACT inhaler Inhale 1-2 puffs into the lungs every 6 (six) hours as needed for wheezing or shortness of breath. 07/09/18   Long, Arlyss RepressJoshua G, MD  beclomethasone (QVAR REDIHALER) 80 MCG/ACT inhaler Inhale 1 puff into the lungs 2 (two) times daily. 05/15/18   Alvira MondaySchlossman, Erin, MD  cetirizine (ZYRTEC ALLERGY) 10 MG tablet Take 1 tablet (10 mg total) by mouth daily. 08/03/17   Marquette SaaLancaster, Abigail Joseph, MD  diphenhydrAMINE (BENADRYL) 25 MG tablet Take 1 tablet (25 mg total) by mouth every 6 (six) hours. 01/28/18   Sabas SousBero, Michael M, MD   fluticasone (FLONASE) 50 MCG/ACT nasal spray Place 2 sprays into both nostrils daily. 11/26/17   Horton, Mayer Maskerourtney F, MD  naproxen (NAPROSYN) 500 MG tablet Take 1 tablet (500 mg total) by mouth 2 (two) times daily. 06/26/18   Rolan BuccoBelfi, Melanie, MD  predniSONE (DELTASONE) 10 MG tablet Take 2 tablets (20 mg total) by mouth daily for 5 days. 08/03/18 08/08/18  , [place] N, PA-C    Family History History reviewed. No pertinent family history.  Social History Social History   Tobacco Use  . Smoking status: Never Smoker  . Smokeless tobacco: Never Used  Substance Use Topics  . Alcohol use: Yes    Comment: occ  . Drug use: Yes    Types: Marijuana     Allergies   Patient has no known allergies.   Review of Systems Review of Systems  Constitutional: Negative for fever.  Respiratory: Positive for chest tightness, shortness of breath and wheezing.   All other systems reviewed and are negative.    Physical Exam Updated Vital Signs BP 130/67   Pulse 99   Temp 98 F (36.7 C)   Resp (!) 22   Ht 5\' 8"  (1.727 m)   Wt 86.2 kg   SpO2 95%   BMI 28.89 kg/m   Physical Exam Vitals signs and nursing note reviewed.  Constitutional:  General: He is not in acute distress.    Appearance: He is well-developed.  HENT:     Head: Normocephalic and atraumatic.     Mouth/Throat:     Mouth: Mucous membranes are moist.     Pharynx: Oropharynx is clear.  Eyes:     Conjunctiva/sclera: Conjunctivae normal.  Neck:     Musculoskeletal: Normal range of motion and neck supple.  Cardiovascular:     Rate and Rhythm: Normal rate and regular rhythm.     Heart sounds: Normal heart sounds.  Pulmonary:     Effort: No respiratory distress.     Breath sounds: Wheezing (Diffuse wheezes bilaterally) present. No rhonchi or rales.     Comments: Patient speaking in full sentences, however with audible wheezes.  Some mild retractions observed. Abdominal:     Palpations: Abdomen is soft.  Skin:     General: Skin is warm.  Neurological:     Mental Status: He is alert.  Psychiatric:        Behavior: Behavior normal.      ED Treatments / Results  Labs (all labs ordered are listed, but only abnormal results are displayed) Labs Reviewed - No data to display  EKG None  Radiology No results found.  Procedures Procedures (including critical care time)  Medications Ordered in ED Medications  AeroChamber Plus Flo-Vu Medium MISC 1 each (has no administration in time range)  methylPREDNISolone sodium succinate (SOLU-MEDROL) 125 mg/2 mL injection 80 mg (80 mg Intramuscular Given 08/03/18 2139)  albuterol (PROVENTIL HFA;VENTOLIN HFA) 108 (90 Base) MCG/ACT inhaler 8 puff (8 puffs Inhalation Given 08/03/18 2149)  ipratropium (ATROVENT HFA) inhaler 2 puff (2 puffs Inhalation Given 08/03/18 2149)     Initial Impression / Assessment and Plan / ED Course  I have reviewed the triage vital signs and the nursing notes.  Pertinent labs & imaging results that were available during my care of the patient were reviewed by me and considered in my medical decision making (see chart for details).        Patient presenting with symptoms consistent with asthma exacerbation.  On arrival, patient is tachypneic, however with normal oxygen saturation on room air.  No infectious symptoms.  Lungs with diffuse wheezes bilaterally, and slight retractions, however speaking in full sentences.  In the setting of the current COVID-19 pandemic, albuterol and Atrovent inhaler administered, IM Solu-Medrol.  Patient with significant improvement in symptoms and lung exam following treatment.  Patient feels he is breathing near his baseline after treatment.  No signs of respiratory distress.  Will discharge with 5 day burst of prednisone.  Pt has been instructed to continue using prescribed medications and to speak with PCP about today's exacerbation.  Discussed results, findings, treatment and follow up. Patient  advised of return precautions. Patient verbalized understanding and agreed with plan.   Final Clinical Impressions(s) / ED Diagnoses   Final diagnoses:  Mild intermittent asthma with exacerbation    ED Discharge Orders         Ordered    predniSONE (DELTASONE) 10 MG tablet  Daily     08/03/18 2235           , [place] N, PA-C 08/03/18 2305    Azalia Bilis, MD 08/04/18 1722

## 2018-08-03 NOTE — Discharge Instructions (Addendum)
Please read instructions below. Use your albuterol inhaler every 4-6 hours as needed for shortness of breath or wheezing. If in 1 hour your shortness of breath or wheezing isn't better after albuterol, use 2 puffs of the atrovent (ipratropium). Use the albuterol FIRST.  Starting tomorrow, begin taking the prednisone, as prescribed, until it is gone. Follow up with your primary care provider. Return to the ER if you have shortness of breath not improved by your inhaler, or new or concerning symptoms.

## 2018-08-03 NOTE — ED Notes (Signed)
ED provider at bedside.

## 2018-08-03 NOTE — ED Triage Notes (Signed)
Patient reports that he was sitting at home relaxin and started to get SOB - he used his inhaler and it has not helped  - patient has audible wheezing - but is also on the phone talking to someone

## 2018-08-16 ENCOUNTER — Emergency Department (HOSPITAL_BASED_OUTPATIENT_CLINIC_OR_DEPARTMENT_OTHER)
Admission: EM | Admit: 2018-08-16 | Discharge: 2018-08-17 | Disposition: A | Discharge status: Home / Self Care | Payer: Self-pay | Attending: Emergency Medicine | Admitting: Emergency Medicine

## 2018-08-16 ENCOUNTER — Encounter (HOSPITAL_BASED_OUTPATIENT_CLINIC_OR_DEPARTMENT_OTHER): Payer: Self-pay | Admitting: Emergency Medicine

## 2018-08-16 ENCOUNTER — Emergency Department (HOSPITAL_BASED_OUTPATIENT_CLINIC_OR_DEPARTMENT_OTHER): Payer: Self-pay

## 2018-08-16 ENCOUNTER — Other Ambulatory Visit: Payer: Self-pay

## 2018-08-16 DIAGNOSIS — J9801 Acute bronchospasm: Secondary | ICD-10-CM | POA: Insufficient documentation

## 2018-08-16 DIAGNOSIS — Z79899 Other long term (current) drug therapy: Secondary | ICD-10-CM | POA: Insufficient documentation

## 2018-08-16 MED ORDER — DEXAMETHASONE SODIUM PHOSPHATE 10 MG/ML IJ SOLN
10.0000 mg | Freq: Once | INTRAMUSCULAR | Status: AC
  Administered 2018-08-16: 10 mg via INTRAMUSCULAR
  Filled 2018-08-16: qty 1

## 2018-08-16 MED ORDER — ALBUTEROL SULFATE HFA 108 (90 BASE) MCG/ACT IN AERS
8.0000 | INHALATION_SPRAY | Freq: Once | RESPIRATORY_TRACT | Status: AC
  Administered 2018-08-16: 8 via RESPIRATORY_TRACT
  Filled 2018-08-16: qty 6.7

## 2018-08-16 NOTE — ED Triage Notes (Signed)
Pt states he has asthma and is having a hard time breathing  Pt states his inhaler is empty  Pt has audible wheezing noted

## 2018-08-16 NOTE — ED Notes (Signed)
Pt is in xray

## 2018-08-16 NOTE — ED Provider Notes (Signed)
[facility] Provider Note: Lowella Dell, MD, FACEP  CSN: [id number] MRN: [id number] ARRIVAL: 08/16/18 at 2307 ROOM: MH04/[facility]04   CHIEF COMPLAINT  Asthma   HISTORY OF PRESENT ILLNESS  08/16/18 11:31 PM Stephen Andrews is a 23 y.o. male with a history of asthma.  He is here with moderate shortness of breath and wheezing for about the past 3 hours.  He is out of his albuterol inhaler as well as his albuterol nebulizer solution.  He has been coughing but denies fever, nausea, vomiting or diarrhea.  He has not had any COVID-19 exposures to his knowledge.  He was given 8 puffs of albuterol inhaler by respiratory therapy prior to my evaluation with improvement.   Past Medical History:  Diagnosis Date  . Asthma   . Back pain   . Testicular torsion     Past Surgical History:  Procedure Laterality Date  . SURGERY SCROTAL / TESTICULAR     testicular torsion surgery     History reviewed. No pertinent family history.  Social History   Tobacco Use  . Smoking status: Never Smoker  . Smokeless tobacco: Never Used  Substance Use Topics  . Alcohol use: Yes    Comment: occ  . Drug use: Yes    Types: Marijuana    Prior to Admission medications   Medication Sig Start Date End Date Taking? Authorizing Provider  albuterol (PROVENTIL HFA;VENTOLIN HFA) 108 (90 Base) MCG/ACT inhaler Inhale 1-2 puffs into the lungs every 6 (six) hours as needed for wheezing or shortness of breath. 07/09/18   Long, Arlyss Repress, MD  beclomethasone (QVAR REDIHALER) 80 MCG/ACT inhaler Inhale 1 puff into the lungs 2 (two) times daily. 05/15/18   Alvira Monday, MD  cetirizine (ZYRTEC ALLERGY) 10 MG tablet Take 1 tablet (10 mg total) by mouth daily. 08/03/17   Marquette Saa, MD  diphenhydrAMINE (BENADRYL) 25 MG tablet Take 1 tablet (25 mg total) by mouth every 6 (six) hours. 01/28/18   Sabas Sous, MD  fluticasone (FLONASE) 50 MCG/ACT nasal spray Place 2 sprays into both nostrils daily.  11/26/17   Horton, Mayer Masker, MD  naproxen (NAPROSYN) 500 MG tablet Take 1 tablet (500 mg total) by mouth 2 (two) times daily. 06/26/18   Rolan Bucco, MD    Allergies Patient has no known allergies.   REVIEW OF SYSTEMS  Negative except as noted here or in the History of Present Illness.   PHYSICAL EXAMINATION  Initial Vital Signs Blood pressure 140/77, pulse (!) 107, temperature 98.4 F (36.9 C), temperature source Oral, SpO2 94 %.  Examination General: Well-developed, well-nourished male in no acute distress; appearance consistent with age of record HENT: normocephalic; atraumatic Eyes: Normal appearance Neck: supple Heart: regular rate and rhythm Lungs: Faint end inspiratory and end expiratory wheezing Abdomen: soft; nondistended; nontender; no masses or hepatosplenomegaly; bowel sounds present Extremities: No deformity; full range of motion Neurologic: Awake, alert and oriented; motor function intact in all extremities and symmetric; no facial droop Skin: Warm and dry Psychiatric: Normal mood and affect   RESULTS  Summary of this visit's results, reviewed by myself:   EKG Interpretation  Date/Time:    Ventricular Rate:    PR Interval:    QRS Duration:   QT Interval:    QTC Calculation:   R Axis:     Text Interpretation:        Laboratory Studies: No results found for this or any previous visit (from the past 24 hour(s)). Imaging Studies:  Dg Chest 2 View  Result Date: 08/16/2018 CLINICAL DATA:  Shortness of breath EXAM: CHEST - 2 VIEW COMPARISON:  07/25/2018 FINDINGS: The heart size and mediastinal contours are within normal limits. Both lungs are clear. The visualized skeletal structures are unremarkable. IMPRESSION: No active cardiopulmonary disease. Electronically Signed   By: Jasmine PangKim  Fujinaga M.D.   On: 08/16/2018 23:54    ED COURSE and MDM  Nursing notes and initial vitals signs, including pulse oximetry, reviewed.  Vitals:   08/16/18 2311 08/16/18 2328   BP: 140/77   Pulse: (!) 107   Temp: 98.4 F (36.9 C)   TempSrc: Oral   SpO2: 93% 94%   Patient improved significantly after albuterol treatment.  Will discharge with a Flovent inhaler and a refill of his albuterol neb solution.  PROCEDURES    ED DIAGNOSES     ICD-10-CM   1. Acute bronchospasm J98.01        Abimelec Grochowski, Jonny RuizJohn, MD 08/17/18 [phone]

## 2018-08-17 MED ORDER — ALBUTEROL SULFATE (2.5 MG/3ML) 0.083% IN NEBU: 2.5000 mg | INHALATION_SOLUTION | RESPIRATORY_TRACT | 12 refills | Status: AC | PRN

## 2018-08-17 MED ORDER — FLUTICASONE PROPIONATE HFA 44 MCG/ACT IN AERO
2.0000 | INHALATION_SPRAY | Freq: Two times a day (BID) | RESPIRATORY_TRACT | Status: DC
  Administered 2018-08-17: 2 via RESPIRATORY_TRACT
  Filled 2018-08-17: qty 10.6

## 2018-08-21 ENCOUNTER — Emergency Department (HOSPITAL_BASED_OUTPATIENT_CLINIC_OR_DEPARTMENT_OTHER)
Admission: EM | Admit: 2018-08-21 | Discharge: 2018-08-21 | Disposition: A | Discharge status: Home / Self Care | Payer: Self-pay | Attending: Emergency Medicine | Admitting: Emergency Medicine

## 2018-08-21 ENCOUNTER — Other Ambulatory Visit: Payer: Self-pay

## 2018-08-21 ENCOUNTER — Encounter (HOSPITAL_BASED_OUTPATIENT_CLINIC_OR_DEPARTMENT_OTHER): Payer: Self-pay | Admitting: Emergency Medicine

## 2018-08-21 DIAGNOSIS — R7989 Other specified abnormal findings of blood chemistry: Secondary | ICD-10-CM

## 2018-08-21 DIAGNOSIS — R1013 Epigastric pain: Secondary | ICD-10-CM | POA: Insufficient documentation

## 2018-08-21 DIAGNOSIS — R945 Abnormal results of liver function studies: Secondary | ICD-10-CM | POA: Insufficient documentation

## 2018-08-21 DIAGNOSIS — J45909 Unspecified asthma, uncomplicated: Secondary | ICD-10-CM | POA: Insufficient documentation

## 2018-08-21 DIAGNOSIS — Z79899 Other long term (current) drug therapy: Secondary | ICD-10-CM | POA: Insufficient documentation

## 2018-08-21 LAB — COMPREHENSIVE METABOLIC PANEL
ALT: 117 U/L — ABNORMAL HIGH (ref 0–44)
AST: 100 U/L — ABNORMAL HIGH (ref 15–41)
Albumin: 3.7 g/dL (ref 3.5–5.0)
Alkaline Phosphatase: 49 U/L (ref 38–126)
Anion gap: 8 (ref 5–15)
BUN: 13 mg/dL (ref 6–20)
CO2: 26 mmol/L (ref 22–32)
Calcium: 8.9 mg/dL (ref 8.9–10.3)
Chloride: 104 mmol/L (ref 98–111)
Creatinine, Ser: 1.14 mg/dL (ref 0.61–1.24)
GFR calc Af Amer: >60 mL/min (ref >60)
GFR calc non Af Amer: >60 mL/min (ref >60)
Glucose, Bld: 100 mg/dL — ABNORMAL HIGH (ref 70–99)
Potassium: 3.6 mmol/L (ref 3.5–5.1)
Sodium: 138 mmol/L (ref 135–145)
Total Bilirubin: 0.5 mg/dL (ref 0.3–1.2)
Total Protein: 7.2 g/dL (ref 6.5–8.1)

## 2018-08-21 LAB — CBC WITH DIFFERENTIAL/PLATELET
Abs Immature Granulocytes: 0.04 10*3/uL (ref 0.00–0.07)
Basophils Absolute: 0 10*3/uL (ref 0.0–0.1)
Basophils Relative: 0 %
Eosinophils Absolute: 0.4 10*3/uL (ref 0.0–0.5)
Eosinophils Relative: 5 %
HCT: 48 % (ref 39.0–52.0)
Hemoglobin: 15.9 g/dL (ref 13.0–17.0)
Immature Granulocytes: 1 %
Lymphocytes Relative: 39 %
Lymphs Abs: 2.6 10*3/uL (ref 0.7–4.0)
MCH: 30.1 pg (ref 26.0–34.0)
MCHC: 33.1 g/dL (ref 30.0–36.0)
MCV: 90.7 fL (ref 80.0–100.0)
Monocytes Absolute: 0.6 10*3/uL (ref 0.1–1.0)
Monocytes Relative: 10 %
Neutro Abs: 3 10*3/uL (ref 1.7–7.7)
Neutrophils Relative %: 45 %
Platelets: 210 10*3/uL (ref 150–400)
RBC: 5.29 MIL/uL (ref 4.22–5.81)
RDW: 12.1 % (ref 11.5–15.5)
WBC: 6.6 10*3/uL (ref 4.0–10.5)
nRBC: 0 % (ref 0.0–0.2)

## 2018-08-21 LAB — URINALYSIS, ROUTINE W REFLEX MICROSCOPIC
Bilirubin Urine: NEGATIVE
Glucose, UA: NEGATIVE mg/dL
Hgb urine dipstick: NEGATIVE
Ketones, ur: NEGATIVE mg/dL
Leukocytes,Ua: NEGATIVE
Nitrite: NEGATIVE
Protein, ur: NEGATIVE mg/dL
Specific Gravity, Urine: >1.03 — ABNORMAL HIGH (ref 1.005–1.030)
pH: 6 (ref 5.0–8.0)

## 2018-08-21 LAB — LIPASE, BLOOD: Lipase: 34 U/L (ref 11–51)

## 2018-08-21 MED ORDER — SUCRALFATE 1 G PO TABS: 1.0000 g | ORAL_TABLET | Freq: Three times a day (TID) | ORAL | 0 refills | Status: DC

## 2018-08-21 MED ORDER — ACETAMINOPHEN ER 650 MG PO TBCR: 650.0000 mg | EXTENDED_RELEASE_TABLET | Freq: Three times a day (TID) | ORAL | 0 refills | Status: AC | PRN

## 2018-08-21 MED FILL — SUCRALFATE 1 GM TABLET: 1 | 7 days supply | Qty: 30 | Fill #0

## 2018-08-21 NOTE — Discharge Instructions (Signed)
We saw you in the ER for the abdominal pain. All of our results are normal. We are not sure what is causing your abdominal pain, and recommend that you see your primary care doctor within 2-3 days for further evaluation. If your symptoms get worse, return to the ER. Take the pain meds and nausea meds as prescribed.

## 2018-08-21 NOTE — ED Triage Notes (Signed)
LLQ pain started yesterday morning.  Has been intermittent ever since.  Pain until 3am and then started again about 8am.  Sts he thought it might be "alcohol poisoning" since he has been drinking more since working at home, but wants to be sure. Denies n/v/d.

## 2018-08-21 NOTE — ED Notes (Signed)
ED Provider at bedside. 

## 2018-08-21 NOTE — ED Provider Notes (Signed)
[facility] EMERGENCY DEPARTMENT Provider Note   CSN: [id number] Arrival date & time: 08/21/18  0913    History   Chief Complaint Chief Complaint  Patient presents with  . Abdominal Pain    HPI Stephen Andrews is a 23 y.o. male.     HPI 23 year old male comes in with chief complaint of abdominal pain. Patient reports that he started having abdominal pain yesterday.  The pain is intermittent, left-sided and described as dull pain that becomes intense and sharp intermittently.  He has no specific evoking factor.  Patient denies any associated nausea, vomiting, fevers, chills, diarrhea.  His last bowel movement was last night and was normal.  Patient denies any UTI-like symptoms.  There is no history of kidney stones or abdominal surgery.  Patient does admit to drinking 4-6 large cans of beer daily for the last several days as he has been working from home.  Patient has history of testicular torsion and his current pain is not similar.  He in fact has no lower quadrant discomfort or testicular pain.  Past Medical History:  Diagnosis Date  . Asthma   . Back pain   . Testicular torsion     There are no active problems to display for this patient.   Past Surgical History:  Procedure Laterality Date  . SURGERY SCROTAL / TESTICULAR     testicular torsion surgery         Home Medications    Prior to Admission medications   Medication Sig Start Date End Date Taking? Authorizing Provider  acetaminophen (TYLENOL 8 HOUR) 650 MG CR tablet Take 1 tablet (650 mg total) by mouth every 8 (eight) hours as needed for pain. 08/21/18   Derwood Kaplan, MD  albuterol (PROVENTIL HFA;VENTOLIN HFA) 108 (90 Base) MCG/ACT inhaler Inhale 1-2 puffs into the lungs every 6 (six) hours as needed for wheezing or shortness of breath. 07/09/18   Long, Arlyss Repress, MD  albuterol (PROVENTIL) (2.5 MG/3ML) 0.083% nebulizer solution Take 3-6 mLs (2.5-5 mg total) by nebulization every 4 (four) hours  as needed for wheezing or shortness of breath. 08/17/18   Molpus, John, MD  beclomethasone (QVAR REDIHALER) 80 MCG/ACT inhaler Inhale 1 puff into the lungs 2 (two) times daily. 05/15/18   Alvira Monday, MD  fluticasone (FLONASE) 50 MCG/ACT nasal spray Place 2 sprays into both nostrils daily. 11/26/17   Horton, Mayer Masker, MD  sucralfate (CARAFATE) 1 g tablet Take 1 tablet (1 g total) by mouth 4 (four) times daily -  with meals and at bedtime. 08/21/18   Derwood Kaplan, MD    Family History No family history on file.  Social History Social History   Tobacco Use  . Smoking status: Never Smoker  . Smokeless tobacco: Never Used  Substance Use Topics  . Alcohol use: Yes    Comment: occ  . Drug use: Yes    Types: Marijuana     Allergies   Patient has no known allergies.   Review of Systems Review of Systems  Constitutional: Positive for activity change.  Gastrointestinal: Positive for abdominal pain. Negative for constipation, diarrhea, nausea and vomiting.  Genitourinary: Negative for dysuria, frequency and testicular pain.  All other systems reviewed and are negative.    Physical Exam Updated Vital Signs BP 114/74 (BP Location: Left Arm)   Pulse 70   Temp 98.1 F (36.7 C) (Oral)   Resp 16   Ht 5\' 8"  (1.727 m)   Wt 86.1 kg   SpO2 98%  BMI 28.86 kg/m   Physical Exam Vitals signs and nursing note reviewed.  Constitutional:      Appearance: He is well-developed.  HENT:     Head: Atraumatic.  Neck:     Musculoskeletal: Neck supple.  Cardiovascular:     Rate and Rhythm: Normal rate.  Pulmonary:     Effort: Pulmonary effort is normal.  Abdominal:     Tenderness: There is abdominal tenderness in the epigastric area, periumbilical area and left upper quadrant. There is no guarding or rebound. Negative signs include Murphy's sign and McBurney's sign.  Skin:    General: Skin is warm.  Neurological:     Mental Status: He is alert and oriented to person, place, and  time.      ED Treatments / Results  Labs (all labs ordered are listed, but only abnormal results are displayed) Labs Reviewed  COMPREHENSIVE METABOLIC PANEL - Abnormal; Notable for the following components:      Result Value   Glucose, Bld 100 (*)    AST 100 (*)    ALT 117 (*)    All other components within normal limits  URINALYSIS, ROUTINE W REFLEX MICROSCOPIC - Abnormal; Notable for the following components:   Specific Gravity, Urine >1.030 (*)    All other components within normal limits  CBC WITH DIFFERENTIAL/PLATELET  LIPASE, BLOOD    EKG None  Radiology No results found.  Procedures Procedures (including critical care time)  Medications Ordered in ED Medications - No data to display   Initial Impression / Assessment and Plan / ED Course  I have reviewed the triage vital signs and the nursing notes.  Pertinent labs & imaging results that were available during my care of the patient were reviewed by me and considered in my medical decision making (see chart for details).        23 year old male comes in a chief complaint of abdominal pain. His pain started yesterday, it is intermittent.  On exam there is no splenomegaly, guarding or rebound tenderness.  Patient has history of testicular torsion, but this pain is not similar and there is no pain in the lower parts of the abdomen or any urinary symptoms.  We will get basic labs right now.  Differential diagnoses includes splenomegaly, appendagitis, diverticulitis.  Reassessment: Patient's lab work is reassuring.  LFTs are mildly elevated, likely because of his heavy alcohol use. Repeat exam is unchanged. At this time I do not think CT scan is going to yield meaningful results.  Rather we will approach this episode of abdominal pain conservatively and asked patient to return to the ER if the symptoms get worse.  Patient is in agreement with the plan.  He is also been advised to slow down in his alcohol  consumption.  Final Clinical Impressions(s) / ED Diagnoses   Final diagnoses:  Epigastric pain  Elevated LFTs    ED Discharge Orders         Ordered    sucralfate (CARAFATE) 1 g tablet  3 times daily with meals & bedtime     08/21/18 1130    acetaminophen (TYLENOL 8 HOUR) 650 MG CR tablet  Every 8 hours PRN     08/21/18 1131           Derwood KaplanNanavati, Victoria Euceda, MD 08/22/18 [id number]

## 2018-10-16 ENCOUNTER — Other Ambulatory Visit: Payer: Self-pay

## 2018-10-16 ENCOUNTER — Emergency Department (HOSPITAL_BASED_OUTPATIENT_CLINIC_OR_DEPARTMENT_OTHER)
Admission: EM | Admit: 2018-10-16 | Discharge: 2018-10-16 | Disposition: A | Discharge status: Home / Self Care | Payer: Self-pay | Attending: Emergency Medicine | Admitting: Emergency Medicine

## 2018-10-16 ENCOUNTER — Encounter (HOSPITAL_BASED_OUTPATIENT_CLINIC_OR_DEPARTMENT_OTHER): Payer: Self-pay

## 2018-10-16 DIAGNOSIS — R2241 Localized swelling, mass and lump, right lower limb: Secondary | ICD-10-CM | POA: Insufficient documentation

## 2018-10-16 DIAGNOSIS — Y929 Unspecified place or not applicable: Secondary | ICD-10-CM | POA: Insufficient documentation

## 2018-10-16 DIAGNOSIS — W57XXXA Bitten or stung by nonvenomous insect and other nonvenomous arthropods, initial encounter: Secondary | ICD-10-CM | POA: Insufficient documentation

## 2018-10-16 DIAGNOSIS — Z79899 Other long term (current) drug therapy: Secondary | ICD-10-CM | POA: Insufficient documentation

## 2018-10-16 DIAGNOSIS — Y9389 Activity, other specified: Secondary | ICD-10-CM | POA: Insufficient documentation

## 2018-10-16 DIAGNOSIS — Y998 Other external cause status: Secondary | ICD-10-CM | POA: Insufficient documentation

## 2018-10-16 DIAGNOSIS — J45909 Unspecified asthma, uncomplicated: Secondary | ICD-10-CM | POA: Insufficient documentation

## 2018-10-16 DIAGNOSIS — F1210 Cannabis abuse, uncomplicated: Secondary | ICD-10-CM | POA: Insufficient documentation

## 2018-10-16 DIAGNOSIS — S80861A Insect bite (nonvenomous), right lower leg, initial encounter: Secondary | ICD-10-CM | POA: Insufficient documentation

## 2018-10-16 MED ORDER — CEPHALEXIN 500 MG PO CAPS: 500.0000 mg | ORAL_CAPSULE | Freq: Four times a day (QID) | ORAL | 0 refills | Status: AC

## 2018-10-16 NOTE — ED Provider Notes (Signed)
[facility] EMERGENCY DEPARTMENT Provider Note   CSN: [id number] Arrival date & time: 10/16/18  1109     History   Chief Complaint Chief Complaint  Patient presents with  . Insect Bite    HPI Harper Masterson is a 23 y.o. male.     The history is provided by the patient and medical records. No language interpreter was used.   Trenton Bunte is a 23 y.o. male who presents to the Emergency Department complaining of false staying. He states that he was stung by a wasp on Saturday night. He thinks he got the stinger out and he applied tobacco. He presents today because he has ongoing pain, redness and swelling to the right distal leg that is spreading down to his ankle. He has ongoing pain that is worse at night as well as itching that is just distal to the site. He is concerned it may be getting infected. He denies any fevers, difficulty breathing, nausea, vomiting, abdominal pain. No prior similar symptoms. Past Medical History:  Diagnosis Date  . Asthma   . Back pain   . Testicular torsion     There are no active problems to display for this patient.   Past Surgical History:  Procedure Laterality Date  . SURGERY SCROTAL / TESTICULAR     testicular torsion surgery         Home Medications    Prior to Admission medications   Medication Sig Start Date End Date Taking? Authorizing Provider  acetaminophen (TYLENOL 8 HOUR) 650 MG CR tablet Take 1 tablet (650 mg total) by mouth every 8 (eight) hours as needed for pain. 08/21/18   Varney Biles, MD  albuterol (PROVENTIL HFA;VENTOLIN HFA) 108 (90 Base) MCG/ACT inhaler Inhale 1-2 puffs into the lungs every 6 (six) hours as needed for wheezing or shortness of breath. 07/09/18   Long, Wonda Olds, MD  albuterol (PROVENTIL) (2.5 MG/3ML) 0.083% nebulizer solution Take 3-6 mLs (2.5-5 mg total) by nebulization every 4 (four) hours as needed for wheezing or shortness of breath. 08/17/18   Molpus, John, MD  beclomethasone (QVAR  REDIHALER) 80 MCG/ACT inhaler Inhale 1 puff into the lungs 2 (two) times daily. 05/15/18   Gareth Morgan, MD  cephALEXin (KEFLEX) 500 MG capsule Take 1 capsule (500 mg total) by mouth 4 (four) times daily. 10/16/18   Quintella Reichert, MD  fluticasone [facility]) 50 MCG/ACT nasal spray Place 2 sprays into both nostrils daily. 11/26/17   Horton, Barbette Hair, MD  sucralfate (CARAFATE) 1 g tablet Take 1 tablet (1 g total) by mouth 4 (four) times daily -  with meals and at bedtime. 08/21/18   Varney Biles, MD    Family History No family history on file.  Social History Social History   Tobacco Use  . Smoking status: Never Smoker  . Smokeless tobacco: Never Used  Substance Use Topics  . Alcohol use: Yes    Comment: occ  . Drug use: Yes    Types: Marijuana     Allergies   Patient has no known allergies.   Review of Systems Review of Systems  All other systems reviewed and are negative.    Physical Exam Updated Vital Signs BP 121/76 (BP Location: Left Arm)   Pulse 72   Temp 98.6 F (37 C) (Oral)   Resp 20   Ht 5\' 8"  (1.727 m)   Wt 88.5 kg   SpO2 99%   BMI 29.65 kg/m   Physical Exam Vitals signs and nursing note  reviewed.  Constitutional:      Appearance: He is well-developed.  HENT:     Head: Normocephalic and atraumatic.  Cardiovascular:     Rate and Rhythm: Normal rate and regular rhythm.  Pulmonary:     Effort: Pulmonary effort is normal. No respiratory distress.  Musculoskeletal:     Comments: 2+ DP pulses. There is a 4 cm area of erythema and healing hematoma on the distal lateral right lower extremity.. There is mild erythema and edema to the right lateral malleolus.  Skin:    General: Skin is warm and dry.  Neurological:     Mental Status: He is alert and oriented to person, place, and time.  Psychiatric:        Mood and Affect: Mood normal.        Behavior: Behavior normal.      ED Treatments / Results  Labs (all labs ordered are listed, but only  abnormal results are displayed) Labs Reviewed - No data to display  EKG    Radiology No results found.  Procedures Procedures (including critical care time)  Medications Ordered in ED Medications - No data to display   Initial Impression / Assessment and Plan / ED Course  I have reviewed the triage vital signs and the nursing notes.  Pertinent labs & imaging results that were available during my care of the patient were reviewed by me and considered in my medical decision making (see chart for details).        Patient here for evaluation of wound following a wasp or bee sting four days ago. Examination is consistent with healing insect bite. Discussed with patient home care for staying. Will prescribe antibiotics that he is to take only if he has worsening redness or swelling. There is no evidence of abscess. Return precautions discussed.  Final Clinical Impressions(s) / ED Diagnoses   Final diagnoses:  Insect bite of right lower leg, initial encounter    ED Discharge Orders         Ordered    cephALEXin (KEFLEX) 500 MG capsule  4 times daily     10/16/18 1318           Tilden Fossaees, Deklynn Charlet, MD 10/16/18 1323

## 2018-10-16 NOTE — ED Triage Notes (Signed)
Pt c/o redness to right LE after a wasp sting 3-4 days ago-NAD-steady gait

## 2018-11-06 ENCOUNTER — Encounter (HOSPITAL_BASED_OUTPATIENT_CLINIC_OR_DEPARTMENT_OTHER): Payer: Self-pay

## 2018-11-06 ENCOUNTER — Other Ambulatory Visit: Payer: Self-pay

## 2018-11-06 ENCOUNTER — Emergency Department (HOSPITAL_BASED_OUTPATIENT_CLINIC_OR_DEPARTMENT_OTHER)
Admission: EM | Admit: 2018-11-06 | Discharge: 2018-11-06 | Disposition: A | Discharge status: Home / Self Care | Payer: Self-pay | Attending: Emergency Medicine | Admitting: Emergency Medicine

## 2018-11-06 DIAGNOSIS — R062 Wheezing: Secondary | ICD-10-CM | POA: Insufficient documentation

## 2018-11-06 DIAGNOSIS — Z5321 Procedure and treatment not carried out due to patient leaving prior to being seen by health care provider: Secondary | ICD-10-CM | POA: Insufficient documentation

## 2018-11-06 DIAGNOSIS — R0602 Shortness of breath: Secondary | ICD-10-CM | POA: Insufficient documentation

## 2018-11-06 MED ORDER — ALBUTEROL SULFATE HFA 108 (90 BASE) MCG/ACT IN AERS
INHALATION_SPRAY | RESPIRATORY_TRACT | Status: AC
  Administered 2018-11-06: 8
  Filled 2018-11-06: qty 6.7

## 2018-11-06 NOTE — ED Triage Notes (Signed)
Asthma flare up started up today.

## 2018-11-06 NOTE — ED Notes (Signed)
RT to triage to assess patients

## 2018-12-29 ENCOUNTER — Emergency Department (HOSPITAL_BASED_OUTPATIENT_CLINIC_OR_DEPARTMENT_OTHER)
Admission: EM | Admit: 2018-12-29 | Discharge: 2018-12-30 | Disposition: A | Discharge status: Home / Self Care | Payer: Self-pay | Attending: Emergency Medicine | Admitting: Emergency Medicine

## 2018-12-29 ENCOUNTER — Other Ambulatory Visit: Payer: Self-pay

## 2018-12-29 ENCOUNTER — Encounter (HOSPITAL_BASED_OUTPATIENT_CLINIC_OR_DEPARTMENT_OTHER): Payer: Self-pay

## 2018-12-29 DIAGNOSIS — J4521 Mild intermittent asthma with (acute) exacerbation: Secondary | ICD-10-CM | POA: Insufficient documentation

## 2018-12-29 MED ORDER — ALBUTEROL SULFATE HFA 108 (90 BASE) MCG/ACT IN AERS
8.0000 | INHALATION_SPRAY | Freq: Once | RESPIRATORY_TRACT | Status: AC
  Administered 2018-12-30: 8 via RESPIRATORY_TRACT
  Filled 2018-12-29: qty 6.7

## 2018-12-29 MED ORDER — LORATADINE 10 MG PO TABS
10.0000 mg | ORAL_TABLET | Freq: Once | ORAL | Status: AC
  Administered 2018-12-30: 10 mg via ORAL
  Filled 2018-12-29: qty 1

## 2018-12-29 MED ORDER — PREDNISONE 50 MG PO TABS
60.0000 mg | ORAL_TABLET | Freq: Once | ORAL | Status: AC
  Administered 2018-12-30: 60 mg via ORAL
  Filled 2018-12-29: qty 1

## 2018-12-29 MED ORDER — IPRATROPIUM BROMIDE HFA 17 MCG/ACT IN AERS
4.0000 | INHALATION_SPRAY | Freq: Once | RESPIRATORY_TRACT | Status: AC
  Administered 2018-12-30: 4 via RESPIRATORY_TRACT
  Filled 2018-12-29: qty 12.9

## 2018-12-29 NOTE — ED Notes (Signed)
ED Provider at bedside. 

## 2018-12-29 NOTE — ED Triage Notes (Signed)
Pt reports feeling SOB x 1.5 hours. Pt has hx of asthma. Wheezing heard. Pt out of inhaler.

## 2018-12-30 ENCOUNTER — Encounter (HOSPITAL_BASED_OUTPATIENT_CLINIC_OR_DEPARTMENT_OTHER): Payer: Self-pay | Admitting: Emergency Medicine

## 2018-12-30 MED ORDER — PREDNISONE 20 MG PO TABS: ORAL_TABLET | ORAL | 0 refills | Status: DC

## 2018-12-30 MED ORDER — ALBUTEROL SULFATE HFA 108 (90 BASE) MCG/ACT IN AERS: 1.0000 | INHALATION_SPRAY | Freq: Four times a day (QID) | RESPIRATORY_TRACT | 0 refills | Status: AC | PRN

## 2018-12-30 NOTE — ED Notes (Signed)
RT at bedside.

## 2018-12-30 NOTE — ED Provider Notes (Signed)
[facility] EMERGENCY DEPARTMENT Provider Note   CSN: [id number] Arrival date & time: 12/29/18  2349     History   Chief Complaint Chief Complaint  Patient presents with  . Asthma    HPI Stephen Andrews is a 23 y.o. male.      Asthma This is a recurrent problem. The current episode started less than 1 hour ago. The problem occurs constantly. The problem has not changed since onset.Pertinent negatives include no chest pain, no abdominal pain, no headaches and no shortness of breath. Nothing aggravates the symptoms. Nothing relieves the symptoms. He has tried nothing for the symptoms. The treatment provided no relief.  Has allergies that cause his asthma to flare up.  No f/c/r. No cough.    Past Medical History:  Diagnosis Date  . Asthma   . Back pain   . Testicular torsion     There are no active problems to display for this patient.   Past Surgical History:  Procedure Laterality Date  . SURGERY SCROTAL / TESTICULAR     testicular torsion surgery         Home Medications    Prior to Admission medications   Medication Sig Start Date End Date Taking? Authorizing Provider  acetaminophen (TYLENOL 8 HOUR) 650 MG CR tablet Take 1 tablet (650 mg total) by mouth every 8 (eight) hours as needed for pain. 08/21/18   Derwood Kaplan, MD  albuterol (PROVENTIL HFA;VENTOLIN HFA) 108 (90 Base) MCG/ACT inhaler Inhale 1-2 puffs into the lungs every 6 (six) hours as needed for wheezing or shortness of breath. 07/09/18   Long, Arlyss Repress, MD  albuterol (PROVENTIL) (2.5 MG/3ML) 0.083% nebulizer solution Take 3-6 mLs (2.5-5 mg total) by nebulization every 4 (four) hours as needed for wheezing or shortness of breath. 08/17/18   Molpus, John, MD  beclomethasone (QVAR REDIHALER) 80 MCG/ACT inhaler Inhale 1 puff into the lungs 2 (two) times daily. 05/15/18   Alvira Monday, MD  cephALEXin (KEFLEX) 500 MG capsule Take 1 capsule (500 mg total) by mouth 4 (four) times daily. 10/16/18    Tilden Fossa, MD  fluticasone [facility]) 50 MCG/ACT nasal spray Place 2 sprays into both nostrils daily. 11/26/17   Horton, Mayer Masker, MD  sucralfate (CARAFATE) 1 g tablet Take 1 tablet (1 g total) by mouth 4 (four) times daily -  with meals and at bedtime. 08/21/18   Derwood Kaplan, MD    Family History No family history on file.  Social History Social History   Tobacco Use  . Smoking status: Never Smoker  . Smokeless tobacco: Never Used  Substance Use Topics  . Alcohol use: Yes    Comment: occ  . Drug use: Yes    Types: Marijuana     Allergies   Patient has no known allergies.   Review of Systems Review of Systems  Constitutional: Negative for fever.  HENT: Negative for congestion.   Eyes: Negative for visual disturbance.  Respiratory: Positive for wheezing. Negative for shortness of breath.   Cardiovascular: Negative for chest pain.  Gastrointestinal: Negative for abdominal pain.  Genitourinary: Negative for difficulty urinating.  Musculoskeletal: Negative for arthralgias.  Skin: Negative for rash.  Neurological: Negative for headaches.  Psychiatric/Behavioral: Negative for agitation.  All other systems reviewed and are negative.    Physical Exam Updated Vital Signs BP (!) 143/73 (BP Location: Right Arm)   Pulse 75   Temp 97.8 F (36.6 C) (Oral)   Resp (!) 22   Ht 5\' 8"  (  1.727 m)   Wt 86.2 kg   SpO2 98%   BMI 28.89 kg/m   Physical Exam Vitals signs and nursing note reviewed.  Constitutional:      General: He is not in acute distress.    Appearance: He is normal weight.  HENT:     Head: Normocephalic and atraumatic.     Nose: Nose normal.  Eyes:     Conjunctiva/sclera: Conjunctivae normal.     Pupils: Pupils are equal, round, and reactive to light.  Neck:     Musculoskeletal: Normal range of motion and neck supple.  Cardiovascular:     Rate and Rhythm: Normal rate and regular rhythm.     Pulses: Normal pulses.     Heart sounds: Normal heart  sounds.  Pulmonary:     Effort: Pulmonary effort is normal. No respiratory distress.     Breath sounds: Wheezing present.  Abdominal:     General: Abdomen is flat. Bowel sounds are normal.     Tenderness: There is no abdominal tenderness. There is no guarding.  Musculoskeletal: Normal range of motion.  Skin:    General: Skin is warm and dry.     Capillary Refill: Capillary refill takes less than 2 seconds.  Neurological:     General: No focal deficit present.     Mental Status: He is alert and oriented to person, place, and time.  Psychiatric:        Mood and Affect: Mood normal.        Behavior: Behavior normal.      ED Treatments / Results  Labs (all labs ordered are listed, but only abnormal results are displayed) Labs Reviewed - No data to display  EKG None  Radiology No results found.  Procedures Procedures (including critical care time)  Medications Ordered in ED Medications  predniSONE (DELTASONE) tablet 60 mg (60 mg Oral Given 12/30/18 0007)  loratadine (CLARITIN) tablet 10 mg (10 mg Oral Given 12/30/18 0007)  albuterol (VENTOLIN HFA) 108 (90 Base) MCG/ACT inhaler 8 puff (8 puffs Inhalation Given 12/30/18 0007)  ipratropium (ATROVENT HFA) inhaler 4 puff (4 puffs Inhalation Given 12/30/18 0007)     Feeling markedly improved post medication.  No covid exposure.  Typical asthma exacerbation.  No signs of chest infection.    Stephen Andrews was evaluated in Emergency Department on 12/30/2018 for the symptoms described in the history of present illness. He was evaluated in the context of the global COVID-19 pandemic, which necessitated consideration that the patient might be at risk for infection with the SARS-CoV-2 virus that causes COVID-19. Institutional protocols and algorithms that pertain to the evaluation of patients at risk for COVID-19 are in a state of rapid change based on information released by regulatory bodies including the CDC and federal and state organizations.  These policies and algorithms were followed during the patient's care in the ED.   Final Clinical Impressions(s) / ED Diagnoses   Return for intractable cough, coughing up blood,fevers >100.4 unrelieved by medication, shortness of breath, intractable vomiting, chest pain, shortness of breath, weakness,numbness, changes in speech, facial asymmetry,abdominal pain, passing out,Inability to tolerate liquids or food, cough, altered mental status or any concerns. No signs of systemic illness or infection. The patient is nontoxic-appearing on exam and vital signs are within normal limits.   I have reviewed the triage vital signs and the nursing notes. Pertinent labs &imaging results that were available during my care of the patient were reviewed by me and considered in my medical  decision making (see chart for details).             After history, exam, and medical workup I feel the patient has been            appropriately medically screened and is safe for discharge home.          Pertinent diagnoses were discussed with the patient. Patient was       given    return precautions    Yahmir Sokolov, MD 12/30/18 [id number]

## 2019-01-30 ENCOUNTER — Emergency Department (HOSPITAL_BASED_OUTPATIENT_CLINIC_OR_DEPARTMENT_OTHER)
Admission: EM | Admit: 2019-01-30 | Discharge: 2019-01-30 | Disposition: A | Discharge status: Home / Self Care | Payer: Self-pay | Attending: Emergency Medicine | Admitting: Emergency Medicine

## 2019-01-30 ENCOUNTER — Encounter (HOSPITAL_BASED_OUTPATIENT_CLINIC_OR_DEPARTMENT_OTHER): Payer: Self-pay | Admitting: *Deleted

## 2019-01-30 DIAGNOSIS — Z760 Encounter for issue of repeat prescription: Secondary | ICD-10-CM | POA: Insufficient documentation

## 2019-01-30 DIAGNOSIS — Z79899 Other long term (current) drug therapy: Secondary | ICD-10-CM | POA: Insufficient documentation

## 2019-01-30 DIAGNOSIS — J45901 Unspecified asthma with (acute) exacerbation: Secondary | ICD-10-CM | POA: Insufficient documentation

## 2019-01-30 MED ORDER — PREDNISONE 50 MG PO TABS: 50.0000 mg | ORAL_TABLET | Freq: Every day | ORAL | 0 refills | Status: AC

## 2019-01-30 MED ORDER — ALBUTEROL SULFATE HFA 108 (90 BASE) MCG/ACT IN AERS
8.0000 | INHALATION_SPRAY | Freq: Once | RESPIRATORY_TRACT | Status: AC
  Administered 2019-01-30: 8 via RESPIRATORY_TRACT
  Filled 2019-01-30: qty 6.7

## 2019-01-30 MED ORDER — PULMICORT FLEXHALER 180 MCG/ACT IN AEPB: 2.0000 | INHALATION_SPRAY | Freq: Every day | RESPIRATORY_TRACT | 0 refills | Status: AC

## 2019-01-30 NOTE — ED Provider Notes (Addendum)
[facility] EMERGENCY DEPARTMENT Provider Note   CSN: [id number] Arrival date & time: 01/30/19  2115     History   Chief Complaint Chief Complaint  Patient presents with  . Shortness of Breath    HPI Stephen Andrews is a 23 y.o. male.     HPI   Patient is a 23 year old male with a history of asthma, who presents the emergency department today complaining of shortness of breath and requesting medication refill.  States that he ran out of his inhaler about 3 days ago and since then has had increased shortness of breath, wheezing and cough.  He has had no fevers.  No chest pain.  He states that this happens frequently and he usually has to come to the ED for a refill of his inhaler.  He does not have a PCP.  He states that he uses albuterol inhaler about 10 times every day chronically.  He has never been on a steroid inhaler.  Denies covid exposures  Past Medical History:  Diagnosis Date  . Asthma   . Back pain   . Testicular torsion     There are no active problems to display for this patient.   Past Surgical History:  Procedure Laterality Date  . SURGERY SCROTAL / TESTICULAR     testicular torsion surgery         Home Medications    Prior to Admission medications   Medication Sig Start Date End Date Taking? Authorizing Provider  acetaminophen (TYLENOL 8 HOUR) 650 MG CR tablet Take 1 tablet (650 mg total) by mouth every 8 (eight) hours as needed for pain. 08/21/18   Derwood KaplanNanavati, Ankit, MD  albuterol (PROVENTIL) (2.5 MG/3ML) 0.083% nebulizer solution Take 3-6 mLs (2.5-5 mg total) by nebulization every 4 (four) hours as needed for wheezing or shortness of breath. 08/17/18   Molpus, John, MD  albuterol (VENTOLIN HFA) 108 (90 Base) MCG/ACT inhaler Inhale 1-2 puffs into the lungs every 6 (six) hours as needed for wheezing or shortness of breath. 12/30/18   Palumbo, April, MD  beclomethasone (QVAR REDIHALER) 80 MCG/ACT inhaler Inhale 1 puff into the lungs 2 (two) times  daily. 05/15/18   Alvira MondaySchlossman, Erin, MD  budesonide (PULMICORT FLEXHALER) 180 MCG/ACT inhaler Inhale 2 puffs into the lungs daily. 01/30/19 03/31/19  Blaise Palladino S, PA-C  cephALEXin (KEFLEX) 500 MG capsule Take 1 capsule (500 mg total) by mouth 4 (four) times daily. 10/16/18   Tilden Fossaees, Elizabeth, MD  fluticasone [facility](FLONASE) 50 MCG/ACT nasal spray Place 2 sprays into both nostrils daily. 11/26/17   Horton, Mayer Maskerourtney F, MD  predniSONE (DELTASONE) 50 MG tablet Take 1 tablet (50 mg total) by mouth daily for 5 days. 01/30/19 02/04/19  Anarie Kalish S, PA-C    Family History History reviewed. No pertinent family history.  Social History Social History   Tobacco Use  . Smoking status: Never Smoker  . Smokeless tobacco: Never Used  Substance Use Topics  . Alcohol use: Yes    Comment: occ  . Drug use: Yes    Types: Marijuana     Allergies   Patient has no known allergies.   Review of Systems Review of Systems  Constitutional: Negative for fever.  HENT: Negative for congestion.   Respiratory: Positive for cough and shortness of breath.   Cardiovascular: Negative for chest pain.  Gastrointestinal: Negative for abdominal pain.  Genitourinary: Negative for dysuria.     Physical Exam Updated Vital Signs BP 123/80   Pulse 88  Temp 98.2 F (36.8 C) (Oral)   Resp 16   Ht 5\' 8"  (1.727 m)   Wt 86.2 kg   SpO2 100%   BMI 28.89 kg/m   Physical Exam Vitals signs and nursing note reviewed.  Constitutional:      Appearance: He is well-developed.  HENT:     Head: Normocephalic and atraumatic.  Eyes:     Conjunctiva/sclera: Conjunctivae normal.  Neck:     Musculoskeletal: Neck supple.  Cardiovascular:     Rate and Rhythm: Normal rate and regular rhythm.     Heart sounds: No murmur.  Pulmonary:     Effort: Pulmonary effort is normal. No tachypnea or respiratory distress.     Breath sounds: Examination of the right-upper field reveals wheezing. Examination of the left-upper field  reveals wheezing. Examination of the right-middle field reveals wheezing. Examination of the left-middle field reveals wheezing. Examination of the right-lower field reveals wheezing. Examination of the left-lower field reveals wheezing. Wheezing (end expiratory) present.  Abdominal:     Palpations: Abdomen is soft.     Tenderness: There is no abdominal tenderness.  Skin:    General: Skin is warm and dry.  Neurological:     Mental Status: He is alert.      ED Treatments / Results  Labs (all labs ordered are listed, but only abnormal results are displayed) Labs Reviewed - No data to display  EKG None  Radiology No results found.  Procedures Procedures (including critical care time)  Medications Ordered in ED Medications  albuterol (VENTOLIN HFA) 108 (90 Base) MCG/ACT inhaler 8 puff (8 puffs Inhalation Given 01/30/19 2134)     Initial Impression / Assessment and Plan / ED Course  I have reviewed the triage vital signs and the nursing notes.  Pertinent labs & imaging results that were available during my care of the patient were reviewed by me and considered in my medical decision making (see chart for details).   Final Clinical Impressions(s) / ED Diagnoses   Final diagnoses:  Exacerbation of asthma, unspecified asthma severity, unspecified whether persistent  Medication refill   Patient is a 23 year old male with a history of asthma, who presents the emergency department today complaining of shortness of breath and requesting medication refill.  States that he ran out of his inhaler about 3 days ago and since then has had increased shortness of breath, wheezing and cough.  He has had no fevers.  No chest pain.  He states that this happens frequently and he usually has to come to the ED for a refill of his inhaler.  He does not have a PCP.  He states that he uses albuterol inhaler about 10 times every day chronically.  He has never been on a steroid inhaler.  Patient was  given 8 puffs of an albuterol inhaler prior to my seeing him.  Per respiratory patient had diffuse wheezing throughout.  On my evaluation he does have end expiratory wheezes.  He is comfortable appearing and does not appear to be any respiratory distress.  He states he feels like his breathing is back to baseline.  Discussed that with his frequent use to follow butyryl inhaler he likely needs to be on steroid inhaler.  He does not have a PCP to prescribe this.  I will give Rx for Pulmicort.  [place] will be [place] new [place] this [place] and I advised that he establish PCP after he moves to [place]. Advised to return to the ED for  new or worsening symptoms in the meantime.  ED Discharge Orders         Ordered    budesonide (PULMICORT FLEXHALER) 180 MCG/ACT inhaler  Daily     01/30/19 2155    predniSONE (DELTASONE) 50 MG tablet  Daily     01/30/19 2155           Rodney Booze, PA-C 01/30/19 2159    Rodney Booze, PA-C 01/30/19 [place]    Hayden Rasmussen, MD 01/30/19 2215

## 2019-01-30 NOTE — ED Triage Notes (Signed)
Pt c/o SOB x 1 hr and requesting refill of albuterol inhaler  HX asthma

## 2019-01-30 NOTE — Discharge Instructions (Addendum)
Please take medications as directed.  Please try to establish with a primary care provider when you move to your new location.  Return to the emergency department for new or worsening symptoms in the meantime.

## 2019-03-20 IMAGING — CR DG CHEST 2V
2 series · 2 of 2 positions shown · non-contrast
Comparison: 09/30/2017

CLINICAL DATA: Left-sided chest pain for several days, no known
injury, initial encounter

EXAM:
CHEST - 2 VIEW

[w chest pa]
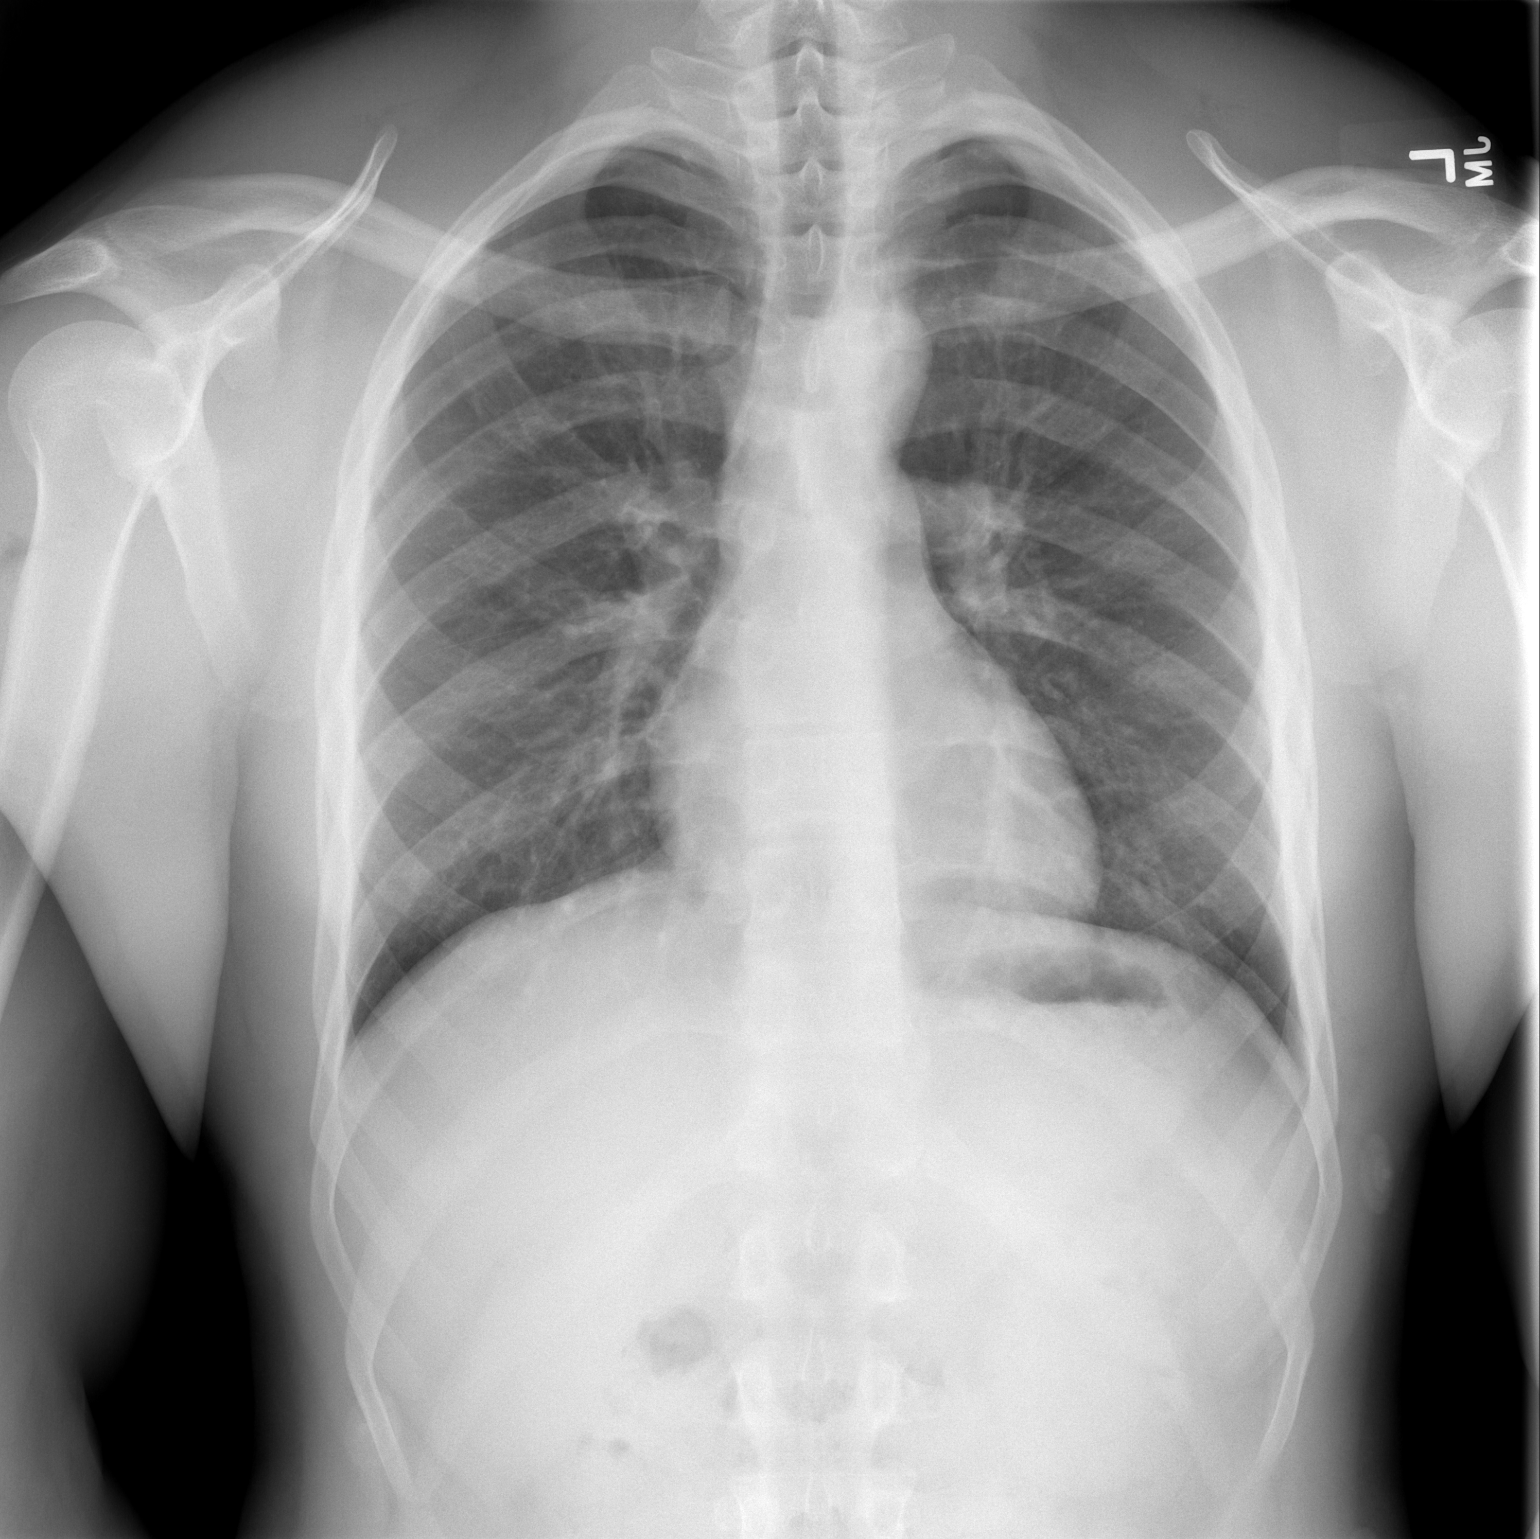

[w chest lat]
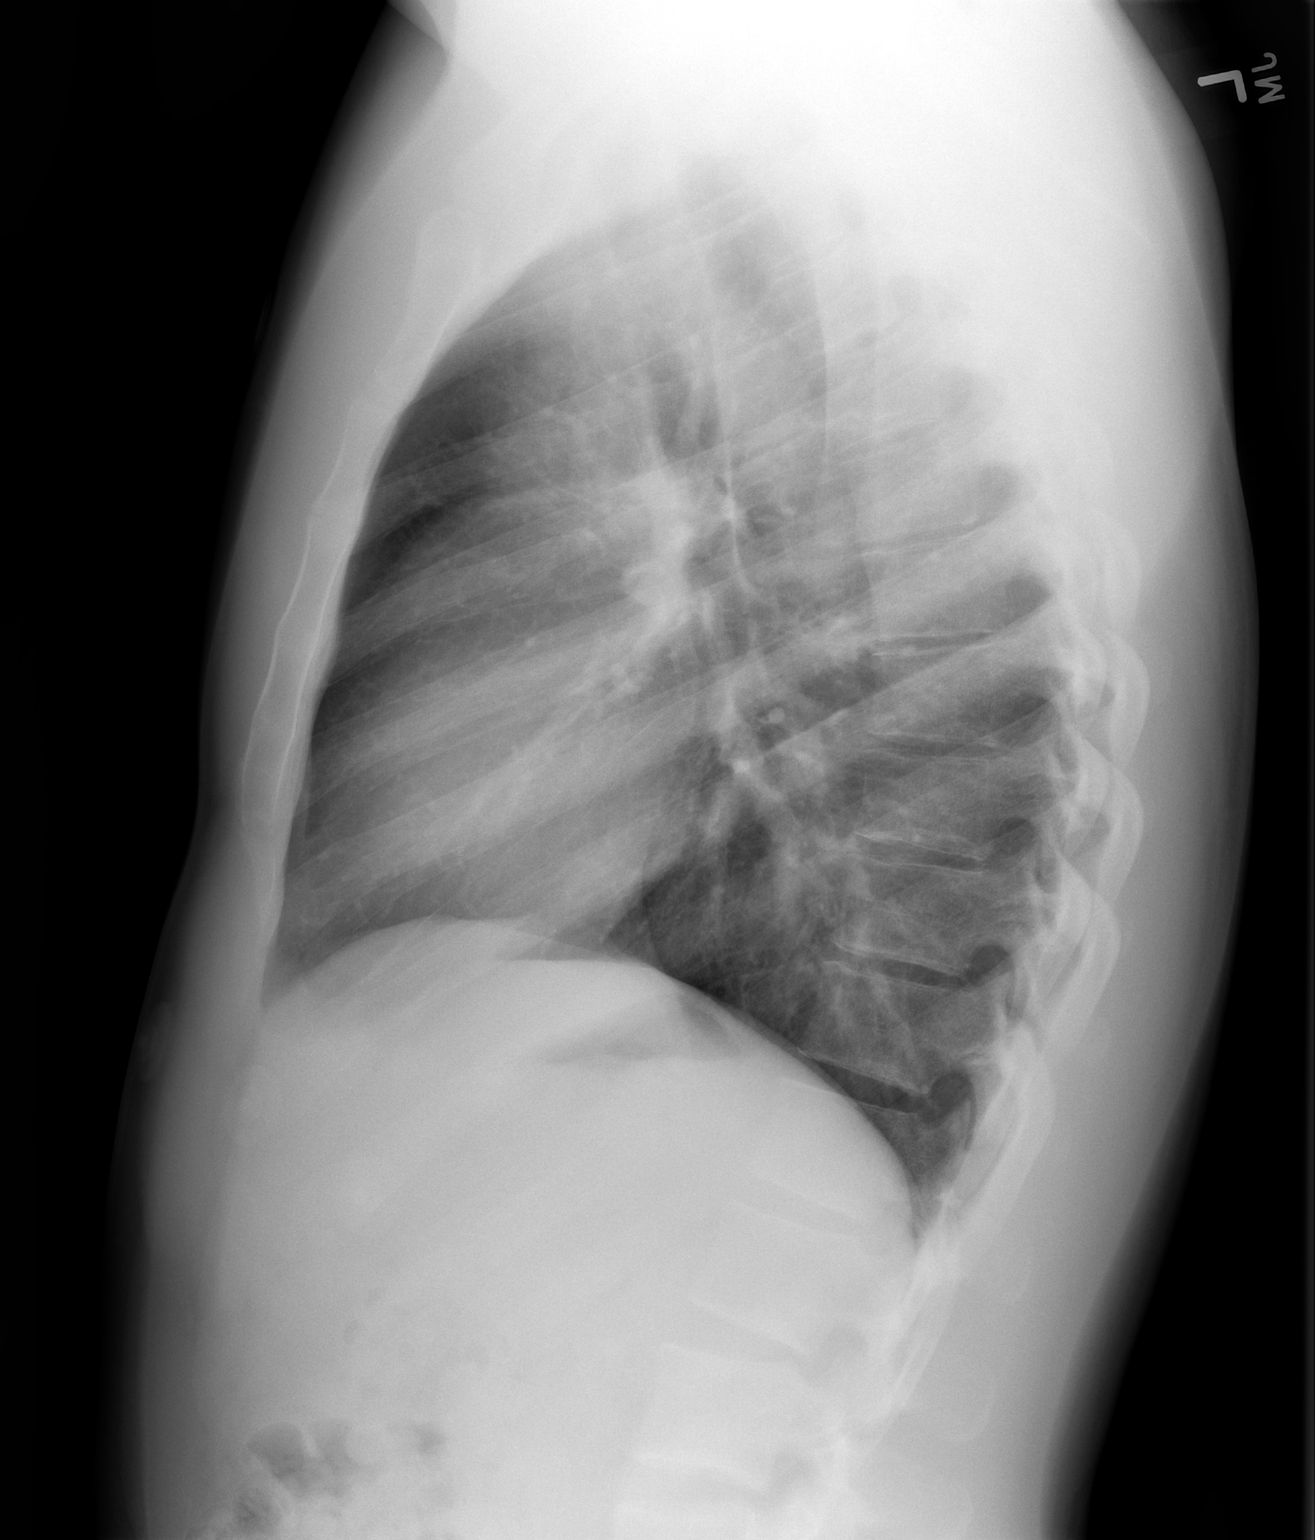

[2 of 2 positions shown; findings below may reference images not displayed]

FINDINGS: The heart size and mediastinal contours are within normal limits.
Both lungs are clear. The visualized skeletal structures are
unremarkable.
IMPRESSION: No active cardiopulmonary disease.
